# Patient Record
Sex: Male | Born: 2000 | Race: Black or African American | Hispanic: No | Marital: Single | Smoking: Never smoker
Health system: Southern US, Community
[De-identification: ages and names within clinical notes are randomized; demographics above are authoritative.]

## PROBLEM LIST (undated history)

## (undated) DIAGNOSIS — T7840XA Allergy, unspecified, initial encounter: Secondary | ICD-10-CM

## (undated) DIAGNOSIS — D649 Anemia, unspecified: Secondary | ICD-10-CM

## (undated) DIAGNOSIS — F988 Other specified behavioral and emotional disorders with onset usually occurring in childhood and adolescence: Secondary | ICD-10-CM

## (undated) DIAGNOSIS — M419 Scoliosis, unspecified: Secondary | ICD-10-CM

## (undated) DIAGNOSIS — G43909 Migraine, unspecified, not intractable, without status migrainosus: Secondary | ICD-10-CM

## (undated) HISTORY — DX: Anemia, unspecified: D64.9

## (undated) HISTORY — DX: Allergy, unspecified, initial encounter: T78.40XA

## (undated) HISTORY — PX: CIRCUMCISION: SUR203

## (undated) HISTORY — PX: EYE SURGERY: SHX253

## (undated) HISTORY — PX: HERNIA REPAIR: SHX51

---
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
## 2022-04-11 ENCOUNTER — Emergency Department (HOSPITAL_BASED_OUTPATIENT_CLINIC_OR_DEPARTMENT_OTHER)
Admission: EM | Admit: 2022-04-11 | Discharge: 2022-04-11 | Disposition: A | Discharge status: Home / Self Care | Payer: Medicaid Other | Attending: Emergency Medicine | Admitting: Emergency Medicine

## 2022-04-11 ENCOUNTER — Encounter (HOSPITAL_BASED_OUTPATIENT_CLINIC_OR_DEPARTMENT_OTHER): Payer: Self-pay | Admitting: Emergency Medicine

## 2022-04-11 ENCOUNTER — Other Ambulatory Visit: Payer: Self-pay

## 2022-04-11 DIAGNOSIS — R519 Headache, unspecified: Secondary | ICD-10-CM | POA: Diagnosis present

## 2022-04-11 DIAGNOSIS — G43809 Other migraine, not intractable, without status migrainosus: Secondary | ICD-10-CM

## 2022-04-11 DIAGNOSIS — G43909 Migraine, unspecified, not intractable, without status migrainosus: Secondary | ICD-10-CM | POA: Diagnosis not present

## 2022-04-11 MED ORDER — KETOROLAC TROMETHAMINE 15 MG/ML IJ SOLN
15.0000 mg | Freq: Once | INTRAMUSCULAR | Status: AC
  Administered 2022-04-11: 15 mg via INTRAVENOUS
  Filled 2022-04-11: qty 1

## 2022-04-11 MED ORDER — ONDANSETRON 4 MG PO TBDP
4.0000 mg | ORAL_TABLET | Freq: Once | ORAL | Status: AC
  Administered 2022-04-11: 4 mg via ORAL
  Filled 2022-04-11: qty 1

## 2022-04-11 MED ORDER — PROCHLORPERAZINE EDISYLATE 10 MG/2ML IJ SOLN
10.0000 mg | Freq: Once | INTRAMUSCULAR | Status: AC
  Administered 2022-04-11: 10 mg via INTRAVENOUS
  Filled 2022-04-11: qty 2

## 2022-04-11 MED ORDER — DIPHENHYDRAMINE HCL 50 MG/ML IJ SOLN
50.0000 mg | Freq: Once | INTRAMUSCULAR | Status: AC
Start: 2022-04-11 — End: 2022-04-11
  Administered 2022-04-11: 50 mg via INTRAVENOUS
  Filled 2022-04-11: qty 1

## 2022-04-11 MED ORDER — SODIUM CHLORIDE 0.9 % IV BOLUS
1000.0000 mL | Freq: Once | INTRAVENOUS | Status: AC
  Administered 2022-04-11: 1000 mL via INTRAVENOUS

## 2022-04-11 MED ORDER — DEXAMETHASONE SODIUM PHOSPHATE 4 MG/ML IJ SOLN
4.0000 mg | Freq: Once | INTRAMUSCULAR | Status: AC
  Administered 2022-04-11: 4 mg via INTRAVENOUS
  Filled 2022-04-11: qty 1

## 2022-04-11 NOTE — ED Triage Notes (Signed)
Pt arrives pov, steady gait to triage, c/o HA since waking, hx of migraines. Endorses n/v ?

## 2022-04-11 NOTE — ED Provider Notes (Signed)
?[facility] EMERGENCY DEPARTMENT ?Provider Note ? ? ?CSN: [id number] ?Arrival date & time: 04/11/22  1618 ? ?  ? ?History ? ?Chief Complaint  ?Patient presents with  ? Headache  ? ? ?Zachary Castaneda is a 21 y.o. male. ? ? ?Headache ? ?Patient is a 21 year old male with history of frequent migraines presenting today with migraine.  Started around 10 AM this morning, he had an associated aura and it was followed by nausea and vomiting.  He is unable to keep any food or water down.  Endorses photophobia and photophobia, states it feels like his typical migraines.  He has not been seen by neurologist in some time but last seen by pediatric neurologist. ? ?Home Medications ?Prior to Admission medications   ?Medication Sig Start Date End Date Taking? Authorizing Provider  ?fluticasone (FLONASE) 50 MCG/ACT nasal spray Place 1 spray into both nostrils daily as needed for allergies or rhinitis.     [provider]  ?ibuprofen (ADVIL) 800 MG tablet Take 1 tablet (800 mg total) by mouth every 6 (six) hours as needed for mild pain or moderate pain. 05/28/20   Adibe, Felix Pacini, MD  ?lisdexamfetamine (VYVANSE) 50 MG capsule Take 50 mg by mouth See admin instructions. Take 50 mg daily Monday through Friday during the school year 08/31/16   [provider]  ?Multiple Vitamins-Minerals (ADULT GUMMY) CHEW Chew 1 tablet by mouth daily.    [provider]  ?ondansetron (ZOFRAN) 4 MG tablet Take 4 mg by mouth every 8 (eight) hours as needed for nausea or vomiting.    [provider]  ?pantoprazole (PROTONIX) 20 MG tablet Take 1 tablet (20 mg total) by mouth daily. 11/17/21 12/17/21  Virgina Norfolk, DO  ?   ? ?Allergies    ?Blueberry [berry]   ? ?Review of Systems   ?Review of Systems  ?Neurological:  Positive for headaches.  ? ?Physical Exam ?Updated Vital Signs ?BP 126/83   Pulse 61   Resp 16   Ht 6\' 5"  (1.956 m)   Wt 86.2 kg   SpO2 100%   BMI 22.53 kg/m?  ?Physical Exam ?Vitals and  nursing note reviewed. Exam conducted with a chaperone present.  ?Constitutional:   ?   General: He is not in acute distress. ?   Appearance: Normal appearance.  ?HENT:  ?   Head: Normocephalic and atraumatic.  ?Eyes:  ?   General: No scleral icterus. ?   Extraocular Movements: Extraocular movements intact.  ?   Right eye: Normal extraocular motion and no nystagmus.  ?   Left eye: Normal extraocular motion and no nystagmus.  ?   Pupils: Pupils are equal, round, and reactive to light.  ?Cardiovascular:  ?   Rate and Rhythm: Normal rate and regular rhythm.  ?Pulmonary:  ?   Effort: Pulmonary effort is normal.  ?   Breath sounds: Normal breath sounds.  ?Skin: ?   Coloration: Skin is not jaundiced.  ?Neurological:  ?   Mental Status: He is alert. Mental status is at baseline.  ?   GCS: GCS eye subscore is 4. GCS verbal subscore is 5. GCS motor subscore is 6.  ?   Cranial Nerves: No cranial nerve deficit.  ?   Coordination: Coordination normal.  ? ? ?ED Results / Procedures / Treatments   ?Labs ?(all labs ordered are listed, but only abnormal results are displayed) ?Labs Reviewed - No data to display ? ?EKG ?None ? ?Radiology ?No results found. ? ?Procedures ?Procedures  ? ? ?  Medications Ordered in ED ?Medications  ?ondansetron (ZOFRAN-ODT) disintegrating tablet 4 mg (4 mg Oral Given 04/11/22 1629)  ?sodium chloride 0.9 % bolus 1,000 mL (1,000 mLs Intravenous New Bag/Given 04/11/22 1914)  ?ketorolac (TORADOL) 15 MG/ML injection 15 mg (15 mg Intravenous Given 04/11/22 1915)  ?dexamethasone (DECADRON) injection 4 mg (4 mg Intravenous Given 04/11/22 1914)  ?prochlorperazine (COMPAZINE) injection 10 mg (10 mg Intravenous Given 04/11/22 1952)  ?diphenhydrAMINE (BENADRYL) injection 50 mg (50 mg Intravenous Given 04/11/22 1953)  ? ? ?ED Course/ Medical Decision Making/ A&P ?  ?                        ?Medical Decision Making ?Risk ?Prescription drug management. ? ? ?Patient presents with migraine.  He is companied by his mother at  bedside who is an independent historian.  There were no focal deficits on neuro exam, migraines are classic of his typical presentation.  I considered alternative causes such as meningitis but does not fit clinical picture. ? ?Ordered patient migraine cocktail including Compazine, Benadryl, Toradol, Decadron and fluids.  His symptoms improved.  Patient does not currently have primary care doctor in the area, provided information to establish care.   ? ? ? ? ? ? ? ?Final Clinical Impression(s) / ED Diagnoses ?Final diagnoses:  ?None  ? ? ?Rx / DC Orders ?ED Discharge Orders   ? ? None  ? ?  ? ? ?  ?Theron Arista, PA-C ?04/11/22 2034 ? ?  ?Arby Barrette, MD ?04/12/22 1945 ? ?

## 2022-04-11 NOTE — ED Notes (Signed)
Patient discharged to home.  All discharge instructions reviewed.  Patient verbalized understanding via teachback method.  Instructions also reviewed with patient's mother, as patient is drowsy following medication administration.  VS WDL.  Respirations even and unlabored.  Ambulatory out of ED.   ?

## 2022-04-11 NOTE — Discharge Instructions (Signed)
Follow up with PCP. If you do not have one currently above is an option in the [facility].  ?

## 2022-06-11 IMAGING — CR DG CHEST 2V
2 series · 2 of 2 positions shown · non-contrast
Comparison: 02/08/2016

CLINICAL DATA: Chest pain

EXAM:
CHEST - 2 VIEW

[w chest pa]
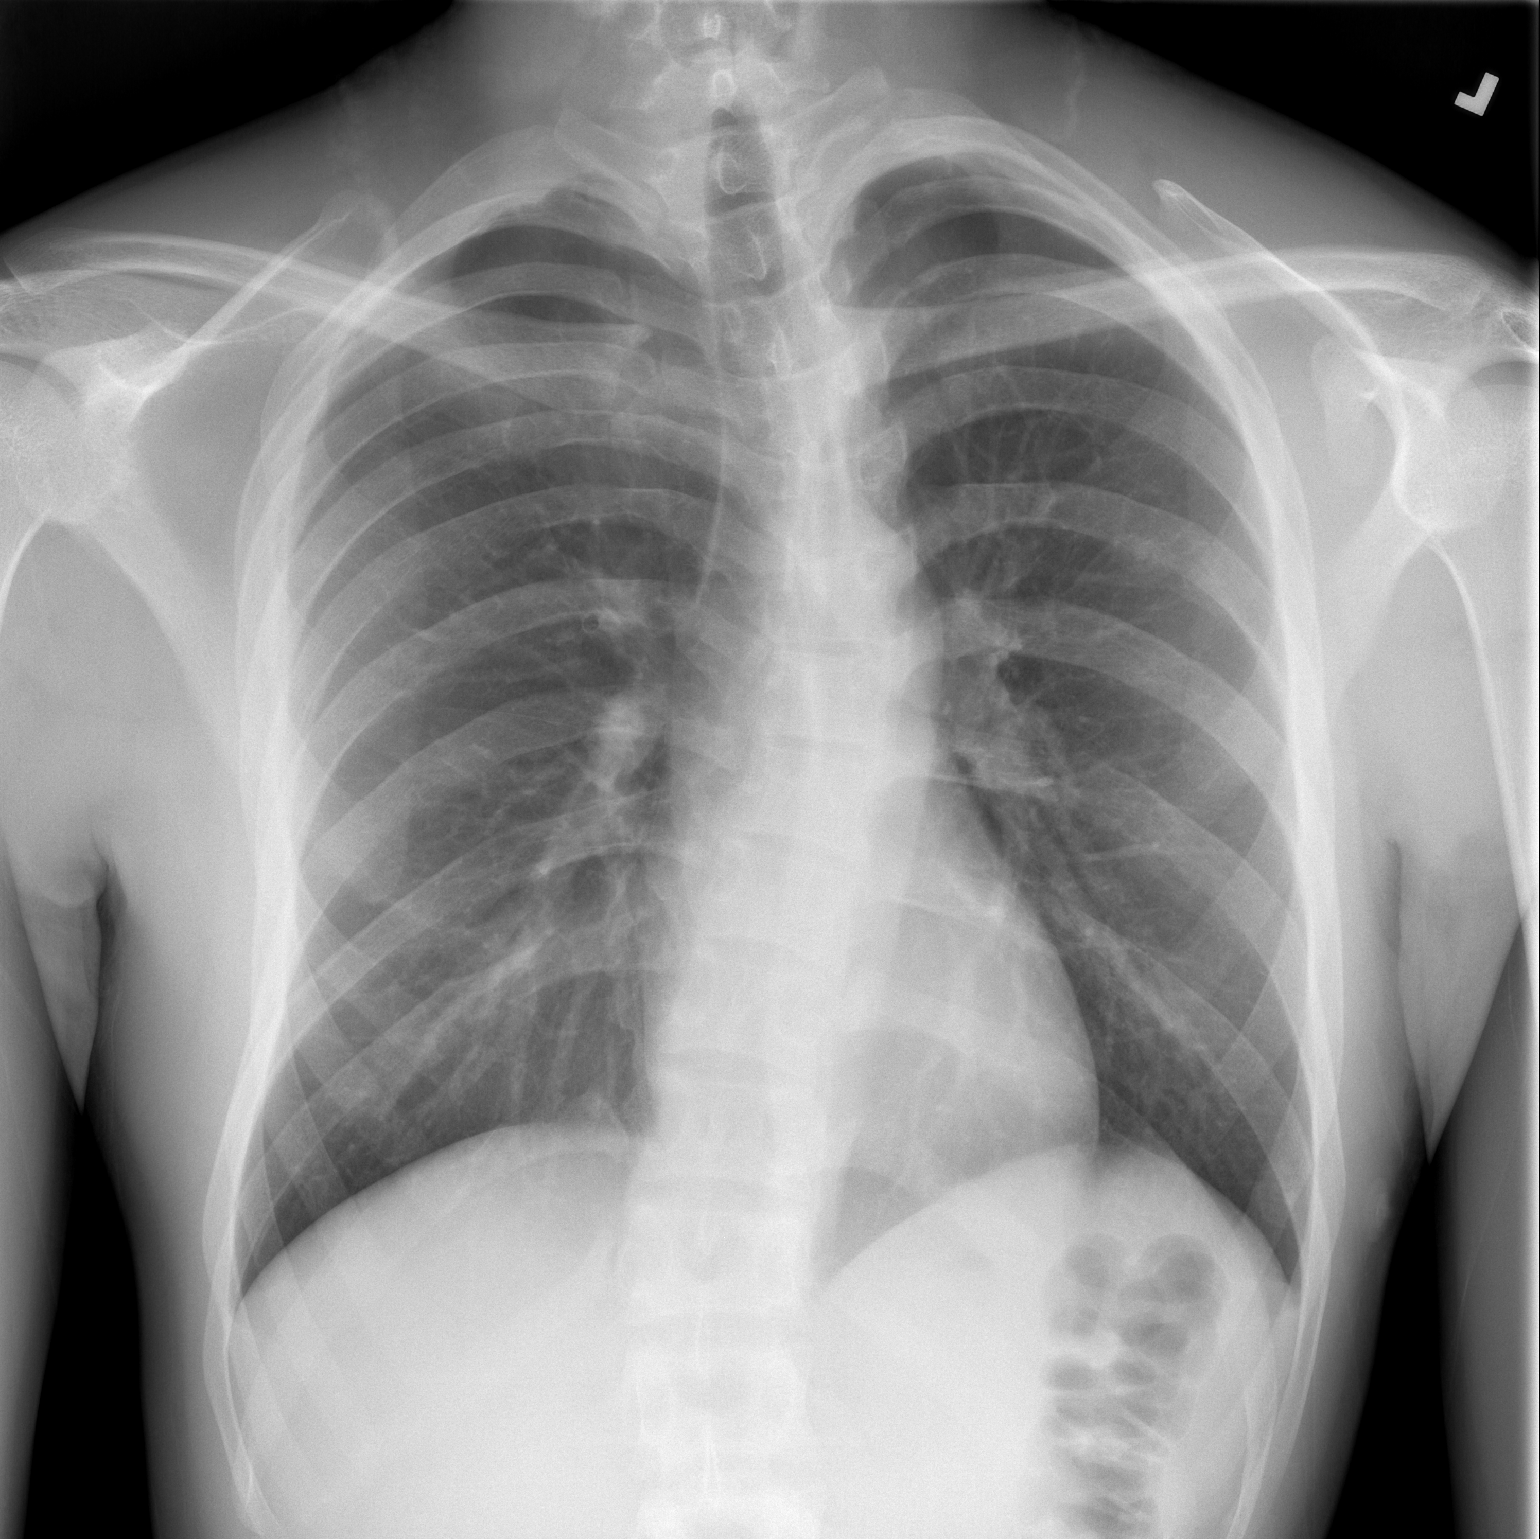

[w chest lat]
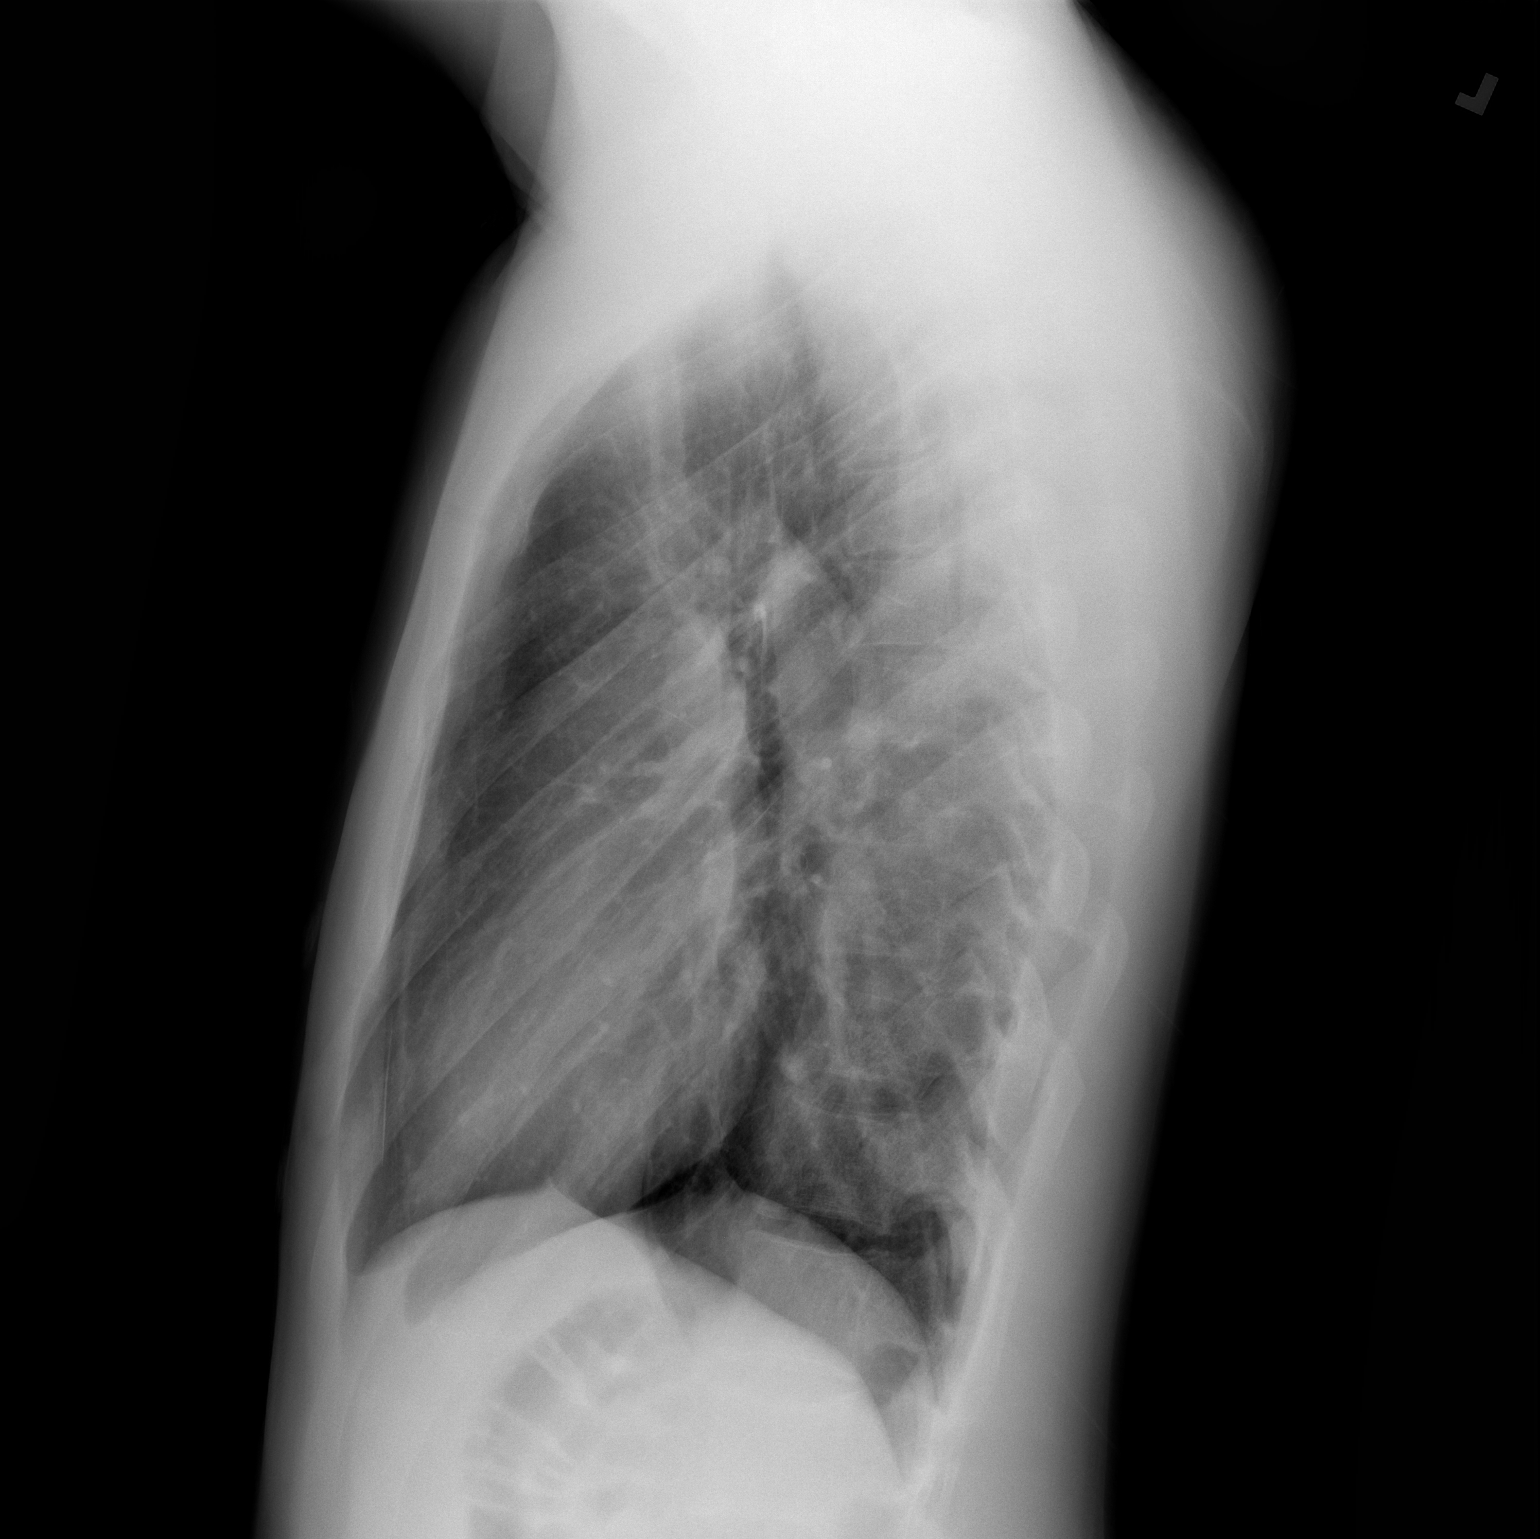

[2 of 2 positions shown; findings below may reference images not displayed]

FINDINGS: The heart size and mediastinal contours are within normal limits.
Both lungs are clear. Thoracic scoliosis is convex towards the left.
No acute osseous findings identified.
IMPRESSION: No active cardiopulmonary disease.

## 2024-08-14 ENCOUNTER — Other Ambulatory Visit: Payer: Self-pay

## 2024-08-14 ENCOUNTER — Emergency Department (HOSPITAL_BASED_OUTPATIENT_CLINIC_OR_DEPARTMENT_OTHER)
Admission: EM | Admit: 2024-08-14 | Discharge: 2024-08-15 | Disposition: A | Discharge status: Home / Self Care | Attending: Emergency Medicine | Admitting: Emergency Medicine

## 2024-08-14 ENCOUNTER — Encounter (HOSPITAL_BASED_OUTPATIENT_CLINIC_OR_DEPARTMENT_OTHER): Payer: Self-pay | Admitting: Emergency Medicine

## 2024-08-14 DIAGNOSIS — G43809 Other migraine, not intractable, without status migrainosus: Secondary | ICD-10-CM | POA: Insufficient documentation

## 2024-08-14 DIAGNOSIS — G43909 Migraine, unspecified, not intractable, without status migrainosus: Secondary | ICD-10-CM | POA: Diagnosis present

## 2024-08-14 MED ORDER — PROCHLORPERAZINE EDISYLATE 10 MG/2ML IJ SOLN
10.0000 mg | Freq: Once | INTRAMUSCULAR | Status: DC
Start: 2024-08-14 — End: 2024-08-15
  Filled 2024-08-14: qty 2

## 2024-08-14 MED ORDER — KETOROLAC TROMETHAMINE 30 MG/ML IJ SOLN
30.0000 mg | Freq: Once | INTRAMUSCULAR | Status: AC
  Administered 2024-08-14: 30 mg via INTRAVENOUS
  Filled 2024-08-14: qty 1

## 2024-08-14 MED ORDER — SODIUM CHLORIDE 0.9 % IV BOLUS
1000.0000 mL | Freq: Once | INTRAVENOUS | Status: AC
  Administered 2024-08-14: 1000 mL via INTRAVENOUS

## 2024-08-14 NOTE — ED Triage Notes (Signed)
 Patient c/o migraine headache all week, patient has history of migraines, does not take meds for them.  Patient endorses photo-sensitivity.

## 2024-08-14 NOTE — ED Provider Notes (Signed)
 [place] [facility] AT [facility]  Provider Note  CSN: [id number] Arrival date & time: 08/14/24 2151  History Chief Complaint  Patient presents with   Migraine    Nikai Quest is a 23 y.o. male with history if migraines has had typical migraine headache off and on for the last 2 weeks. Described as a frontal headache with photophobia. He also reports he felt 'disoriented' for a time yesterday but not today. He has not had fever or neck stiffness, no thunderclap headache. Not prescribed any medications for migraines.    Home Medications Prior to Admission medications   Medication Sig Start Date End Date Taking? Authorizing Provider  fluticasone (FLONASE) 50 MCG/ACT nasal spray Place 1 spray into both nostrils daily as needed for allergies or rhinitis.     [provider]  ibuprofen  (ADVIL ) 800 MG tablet Take 1 tablet (800 mg total) by mouth every 6 (six) hours as needed for mild pain or moderate pain. 05/28/20   Adibe, Obinna O, MD  lisdexamfetamine (VYVANSE) 50 MG capsule Take 50 mg by mouth See admin instructions. Take 50 mg daily Monday through Friday during the school year 08/31/16   [provider]  Multiple Vitamins-Minerals (ADULT GUMMY) CHEW Chew 1 tablet by mouth daily.    [provider]  ondansetron  (ZOFRAN ) 4 MG tablet Take 4 mg by mouth every 8 (eight) hours as needed for nausea or vomiting.    [provider]  pantoprazole  (PROTONIX ) 20 MG tablet Take 1 tablet (20 mg total) by mouth daily. 11/17/21 12/17/21  Ruthe Cornet, DO     Allergies    Blueberry [berry]   Review of Systems   Review of Systems Please see HPI for pertinent positives and negatives  Physical Exam BP 116/78 (BP Location: Left Arm)   Pulse (!) 107   Temp 99.1 F (37.3 C) (Oral)   Resp 14   Ht 6' 5 (1.956 m)   Wt 86.2 kg   SpO2 99%   BMI 22.53 kg/m   Physical Exam Vitals and nursing note reviewed.  Constitutional:       Appearance: Normal appearance.  HENT:     Head: Normocephalic and atraumatic.     Nose: Nose normal.     Mouth/Throat:     Mouth: Mucous membranes are moist.  Eyes:     Extraocular Movements: Extraocular movements intact.     Conjunctiva/sclera: Conjunctivae normal.     Pupils: Pupils are equal, round, and reactive to light.  Cardiovascular:     Rate and Rhythm: Normal rate.  Pulmonary:     Effort: Pulmonary effort is normal.     Breath sounds: Normal breath sounds.  Abdominal:     General: Abdomen is flat.     Palpations: Abdomen is soft.     Tenderness: There is no abdominal tenderness.  Musculoskeletal:        General: No swelling. Normal range of motion.     Cervical back: Neck supple.  Skin:    General: Skin is warm and dry.  Neurological:     General: No focal deficit present.     Mental Status: He is alert and oriented to person, place, and time.     Cranial Nerves: No cranial nerve deficit.     Sensory: No sensory deficit.     Motor: No weakness.  Psychiatric:        Mood and Affect: Mood normal.     ED Results / Procedures / Treatments  EKG None  Procedures Procedures  Medications Ordered in the ED Medications  prochlorperazine  (COMPAZINE ) injection 10 mg (10 mg Intravenous Patient Refused/Not Given 08/14/24 2350)  ketorolac  (TORADOL ) 30 MG/ML injection 30 mg (30 mg Intravenous Given 08/14/24 2353)  sodium chloride  0.9 % bolus 1,000 mL (0 mLs Intravenous Stopped 08/15/24 0054)    Initial Impression and Plan  Patient with history of migraines here with typical headache. Vitals and exam are reassuring. Unclear what his episode of disorientation yesterday was, he describes it as 'having memories that weren't his'. Regardless, he is awake and alert now, will give migraine cocktail and reassess.   ED Course   Clinical Course as of 08/15/24 0114  Wed Aug 15, 2024  0053 Patient declined compazine  due to prior akathisia reaction.  [CS]  0113 Patient reports  headache is improved and he is ready to go home. Recommend PCP and/or Neurology follow up for long term management. RTED for any other concerns.  [CS]    Clinical Course User Index [CS] Roselyn Carlin NOVAK, MD     MDM Rules/Calculators/A&P Medical Decision Making Problems Addressed: Other migraine without status migrainosus, not intractable: acute illness or injury  Risk Prescription drug management.     Final Clinical Impression(s) / ED Diagnoses Final diagnoses:  Other migraine without status migrainosus, not intractable    Rx / DC Orders ED Discharge Orders          Ordered    Ambulatory referral to Neurology       Comments: An appointment is requested in approximately: 4 weeks   08/15/24 0114             Roselyn Carlin NOVAK, MD 08/15/24 [phone]

## 2024-08-15 NOTE — ED Notes (Signed)
 Patient refuses x2 compazine  per EDP. States he doees not like the way it makes him feel.

## 2024-08-20 ENCOUNTER — Encounter (HOSPITAL_COMMUNITY): Payer: Self-pay

## 2024-08-20 ENCOUNTER — Emergency Department (HOSPITAL_COMMUNITY)
Admission: EM | Admit: 2024-08-20 | Discharge: 2024-08-21 | Disposition: A | Discharge status: Home / Self Care | Attending: Emergency Medicine | Admitting: Emergency Medicine

## 2024-08-20 ENCOUNTER — Other Ambulatory Visit: Payer: Self-pay

## 2024-08-20 DIAGNOSIS — Z79899 Other long term (current) drug therapy: Secondary | ICD-10-CM | POA: Insufficient documentation

## 2024-08-20 DIAGNOSIS — G43809 Other migraine, not intractable, without status migrainosus: Secondary | ICD-10-CM | POA: Insufficient documentation

## 2024-08-20 DIAGNOSIS — R519 Headache, unspecified: Secondary | ICD-10-CM | POA: Diagnosis present

## 2024-08-20 NOTE — ED Triage Notes (Addendum)
 Pt arrived from [facility] via POV with parents at bedside. Pt states that he has had migraine headaches for weeks which have blurry vision as a prescursor. Pts mother spoke up during triage and states that the pt has notable lost weight recently and had blood shot eyes and well as pain though out his limbs. Pt then stated that he gets nauseous if he is about to have a headache and he eats something. Pt at present does not have a head ahce or nausea. Pt states that he does not currently have a PCP to follow up with. Pts mother again spoke up and states that pt was seen at Urgent care last week and was given a shot for the headache, but that no blood work was done and no one tested him for the virus. [facility] in triage 0

## 2024-08-20 NOTE — ED Triage Notes (Signed)
 Pt in with migraine x several weeks, states he began having R arm and L side numbness a few days ago.

## 2024-08-21 ENCOUNTER — Emergency Department (HOSPITAL_COMMUNITY)

## 2024-08-21 LAB — CBC
HCT: 37.5 % — ABNORMAL LOW (ref 39.0–52.0)
Hemoglobin: 11.6 g/dL — ABNORMAL LOW (ref 13.0–17.0)
MCH: 21.3 pg — ABNORMAL LOW (ref 26.0–34.0)
MCHC: 30.9 g/dL (ref 30.0–36.0)
MCV: 68.9 fL — ABNORMAL LOW (ref 80.0–100.0)
Platelets: 280 K/uL (ref 150–400)
RBC: 5.44 MIL/uL (ref 4.22–5.81)
RDW: 14.4 % (ref 11.5–15.5)
WBC: 7.6 K/uL (ref 4.0–10.5)
nRBC: 0 % (ref 0.0–0.2)

## 2024-08-21 LAB — BASIC METABOLIC PANEL WITH GFR
Anion gap: 10 (ref 5–15)
BUN: <5 mg/dL — ABNORMAL LOW (ref 6–20)
CO2: 25 mmol/L (ref 22–32)
Calcium: 8.8 mg/dL — ABNORMAL LOW (ref 8.9–10.3)
Chloride: 100 mmol/L (ref 98–111)
Creatinine, Ser: 0.94 mg/dL (ref 0.61–1.24)
GFR, Estimated: >60 mL/min (ref >60)
Glucose, Bld: 93 mg/dL (ref 70–99)
Potassium: 4 mmol/L (ref 3.5–5.1)
Sodium: 135 mmol/L (ref 135–145)

## 2024-08-21 LAB — RESP PANEL BY RT-PCR (RSV, FLU A&B, COVID)  RVPGX2
Influenza A by PCR: NEGATIVE
Influenza B by PCR: NEGATIVE
Resp Syncytial Virus by PCR: NEGATIVE
SARS Coronavirus 2 by RT PCR: NEGATIVE

## 2024-08-21 MED ORDER — KETOROLAC TROMETHAMINE 15 MG/ML IJ SOLN
15.0000 mg | Freq: Once | INTRAMUSCULAR | Status: AC
  Administered 2024-08-21: 15 mg via INTRAVENOUS
  Filled 2024-08-21: qty 1

## 2024-08-21 MED ORDER — METOCLOPRAMIDE HCL 5 MG/ML IJ SOLN
10.0000 mg | Freq: Once | INTRAMUSCULAR | Status: AC
  Administered 2024-08-21: 10 mg via INTRAVENOUS
  Filled 2024-08-21: qty 2

## 2024-08-21 MED ORDER — DIPHENHYDRAMINE HCL 50 MG/ML IJ SOLN
12.5000 mg | Freq: Once | INTRAMUSCULAR | Status: AC
  Administered 2024-08-21: 12.5 mg via INTRAVENOUS
  Filled 2024-08-21: qty 1

## 2024-08-21 NOTE — ED Notes (Signed)
 Patient mother states that a couple days ago patient was disoriented.

## 2024-08-21 NOTE — ED Provider Notes (Signed)
 [place] [facility] DEPARTMENT AT [facility] Provider Note   CSN: [id number] Arrival date & time: 08/20/24  2246     Patient presents with: Headache and Numbness   Zachary Castaneda is a 23 y.o. male.   The history is provided by the patient and a parent.  Patient reports migraine headache for several weeks.  He reports at times he will have blurred vision when the headache starts.  He has also had intermittent confusion per parents.  No falls or head trauma.  No travel.  No fevers or vomiting.  No tick bites or rashes.  He has had headaches intermittently for years but this appears worse than usual.  Mother reports that he appears to have lost weight.  The headache is not sudden onset  No focal weakness.  He has had intermittent numbness in the right arm but none at this time Denies any visual loss or diplopia. Reports family history of migraines, but no other family history of neurologic conditions.   Mother is concerned that no recent workup has been done for patient  Prior to Admission medications   Medication Sig Start Date End Date Taking? Authorizing Provider  fluticasone (FLONASE) 50 MCG/ACT nasal spray Place 1 spray into both nostrils daily as needed for allergies or rhinitis.     [provider]  lisdexamfetamine (VYVANSE) 50 MG capsule Take 50 mg by mouth See admin instructions. Take 50 mg daily Monday through Friday during the school year 08/31/16   [provider]  Multiple Vitamins-Minerals (ADULT GUMMY) CHEW Chew 1 tablet by mouth daily.    [provider]    Allergies: Blueberry [berry]    Review of Systems  Constitutional:  Positive for fatigue and unexpected weight change. Negative for fever.  Eyes:  Positive for photophobia.       Denies diplopia  Skin:  Negative for rash.  Neurological:  Positive for numbness and headaches. Negative for seizures, syncope, facial asymmetry, speech difficulty and weakness.    Updated  Vital Signs BP 109/74   Pulse 92   Temp 99 F (37.2 C) (Oral)   Resp 14   Ht 1.956 m (6' 5)   Wt 83.9 kg   SpO2 100%   BMI 21.94 kg/m   Physical Exam CONSTITUTIONAL: Well developed/well nourished HEAD: Normocephalic/atraumatic EYES: EOMI/PERRL, no nystagmus, no ptosis, normal fundoscopic exam (no papilledema)  ENMT: Mucous membranes moist NECK: supple no meningeal signs, no bruits SPINE/BACK:entire spine nontender CV: S1/S2 noted, no murmurs/rubs/gallops noted LUNGS: Lungs are clear to auscultation bilaterally, no apparent distress ABDOMEN: soft, nontender, no rebound or guarding GU:no cva tenderness NEURO:Awake/alert, face symmetric, no arm or leg drift is noted Equal 5/5 strength with shoulder abduction, elbow flex/extension, wrist flex/extension in upper extremities and equal hand grips bilaterally Equal 5/5 strength with hip flexion,knee flex/extension, foot dorsi/plantar flexion Cranial nerves 3/4/5/6/06/27/09/11/12 tested and intact Gait normal without ataxia Sensation to light touch intact in all extremities EXTREMITIES: pulses normal, full ROM, no deformtieis SKIN: warm, color normal PSYCH: no abnormalities of mood noted, alert and oriented to situation  (all labs ordered are listed, but only abnormal results are displayed) Labs Reviewed  BASIC METABOLIC PANEL WITH GFR - Abnormal; Notable for the following components:      Result Value   BUN <5 (*)    Calcium 8.8 (*)    All other components within normal limits  CBC - Abnormal; Notable for the following components:   Hemoglobin 11.6 (*)    HCT 37.5 (*)  MCV 68.9 (*)    MCH 21.3 (*)    All other components within normal limits  RESP PANEL BY RT-PCR (RSV, FLU A&B, COVID)  [id number]    EKG: None  Radiology: MR MRV HEAD WO CM Result Date: 08/21/2024 EXAM: MRV BRAIN 08/21/2024 01:55:59 AM TECHNIQUE: Multiplanar multisequence MRV of the head was performed without the administration of intravenous contrast.  Multiplanar 2D and 3D reformatted images are provided for review. COMPARISON: None available. CLINICAL HISTORY: Dural venous sinus thrombosis suspected; Headache, intracranial hypertension features. 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 Otherwise, normal appearance of the major intracranial venous structures. IMPRESSION: 1. No evidence of dural venous sinus thrombosis. 2. Diminutive left transverse sinus, a common congenital variant. Electronically signed by: Franky Stanford MD 08/21/2024 02:07 AM EDT RP Workstation: [id number]   MR BRAIN WO CONTRAST Result Date: 08/21/2024 EXAM: MR Brain without Intravenous Contrast. CLINICAL HISTORY: Headache, tension-type. TECHNIQUE: Magnetic resonance images of the brain without intravenous contrast in multiple planes. CONTRAST: Without. COMPARISON: None provided. FINDINGS: BRAIN: No restricted diffusion to indicate acute infarction. No intracranial mass or hemorrhage. No midline shift or extra-axial fluid collection. No cerebellar tonsillar ectopia. The central arterial and venous flow voids are patent. VENTRICLES: No hydrocephalus. ORBITS: The orbits are normal. SINUSES AND MASTOIDS: The sinuses and mastoid air cells are clear. BONES: No acute fracture or focal osseous lesion. IMPRESSION: 1. Unremarkable brain MRI. Electronically signed by: Franky Stanford MD 08/21/2024 02:01 AM EDT RP Workstation: [id number]     Procedures   Medications Ordered in the ED  metoCLOPramide  (REGLAN ) injection 10 mg (10 mg Intravenous Given 08/21/24 0049)  diphenhydrAMINE  (BENADRYL ) injection 12.5 mg (12.5 mg Intravenous Given 08/21/24 0047)  ketorolac  (TORADOL ) 15 MG/ML injection 15 mg (15 mg Intravenous Given 08/21/24 0048)    Clinical Course as of 08/21/24 0240  Tue Aug 21, 2024  0045 Patient reports ongoing migraine for weeks. Mother also thinks he has lost weight.  He has never had a formal diagnosis/evaluation for migraines.  Due to persistent  headaches, will obtain MRI/MRV brain Will also treat his headaches. If all workup is negative, patient will be safe for outpatient management and urgent referral to [DW]  0238 Patient feeling improved.  MRI negative. Labs overall reassuring Referral to neurology has been placed.  Also gave instructions to have a follow-up with a PCP  Patient and parents are comfortable w/exam [DW]    Clinical Course User Index [DW] Midge Golas, MD                                 Medical Decision Making Amount and/or Complexity of Data Reviewed Labs: ordered. Radiology: ordered.  Risk Prescription drug management.   This patient presents to the ED for concern of headache, this involves an extensive number of treatment options, and is a complaint that carries with it a high risk of complications and morbidity.  The differential diagnosis includes but is not limited to subarachnoid hemorrhage, intracranial hemorrhage, meningitis, encephalitis, CVST, idiopathic intracranial hypertension, migraine    Comorbidities that complicate the patient evaluation: Patient's presentation is complicated by their history of previous migraines  Social Determinants of Health: Patient's impaired access to primary care  increases the complexity of managing their presentation  Additional history obtained: Additional history obtained from family Records reviewed previous admission documents  Lab Tests: I Ordered, and personally interpreted labs.  The pertinent results include: Labs overall reassuring, at  baseline  Imaging Studies ordered: I ordered imaging studies including MRI brain  I independently visualized and interpreted imaging which showed no acute findings I agree with the radiologist interpretation  Medicines ordered and prescription drug management: I ordered medication including reglan   for headache  Reevaluation of the patient after these medicines showed that the patient     improved   Reevaluation: After the interventions noted above, I reevaluated the patient and found that they have :improved  Complexity of problems addressed: Patient's presentation is most consistent with  acute presentation with potential threat to life or bodily function  Disposition: After consideration of the diagnostic results and the patient's response to treatment,  I feel that the patent would benefit from discharge  .        Final diagnoses:  Other migraine without status migrainosus, not intractable    ED Discharge Orders          Ordered    Ambulatory referral to Neurology       Comments: An appointment is requested in approximately: 2 weeks   08/21/24 0045               Midge Golas, MD 08/21/24 [phone]

## 2024-08-21 NOTE — Discharge Instructions (Addendum)
 You can go to [email or web address] to find a provider near where you live  SEEK MEDICAL ATTENTION IF:  You develop possible problems with medications prescribed.  The medications don't resolve your headache, if it recurs , or if you have multiple episodes of vomiting or can't take fluids. You have a change from the usual headache.  RETURN IMMEDIATELY IF you develop a sudden, severe headache or confusion, become poorly responsive or faint, develop a fever above 100.61F or problem breathing, have a change in speech, vision, swallowing, or understanding, or develop new weakness, numbness, tingling, incoordination, or have a seizure.

## 2024-08-21 NOTE — ED Notes (Signed)
 Patient transported to MRI

## 2024-08-27 ENCOUNTER — Ambulatory Visit: Payer: Self-pay

## 2024-08-27 NOTE — Telephone Encounter (Signed)
  FYI Only or Action Required?: FYI only for provider.  Patient was last seen in primary care on new patient.  Called Nurse Triage reporting Migraine and Blurred Vision.  Symptoms began several years ago.  Interventions attempted: OTC medications: tylenol /ibuprofen .  Symptoms are: unchanged.  Triage Disposition: Home Care  Patient/caregiver understands and will follow disposition?: Yes  Message from [facility] sent at 08/27/2024 11:35 AM EDT  Summary: Symptoms. not feeling well.   Reason for Triage: Patient's dad called for patient but would like for patient to be contacted. Patient been having blurred vision, right side, patient has been having on and off migraines but they are not botheing hit right now. Patient was taken to the ER and needs to see a PCP. Patient is scheduled for an appointment for 09/26.  [phone]         Reason for Disposition  Similar to previously diagnosed migraine headaches  Answer Assessment - Initial Assessment Questions 1. LOCATION: Where does it hurt?      Different places, front side or back of head 2. ONSET: When did the headache start? (e.g., minutes, hours, days)      History or migraine since childhood, about 14 years 3. PATTERN: Does the pain come and go, or has it been constant since it started?     Comes and goes 4. SEVERITY: How bad is the pain? and What does it keep you from doing?  (e.g., Scale 1-10; mild, moderate, or severe)     No head ache at this time or visual disturbances 5. RECURRENT SYMPTOM: Have you ever had headaches before? If Yes, ask: When was the last time? and What happened that time?      Has never taken migraine preventative, uses otc tylenol  or ibuprofen  to treat. Lately the medications have not been helping 6. CAUSE: What do you think is causing the headache?    Extra bright Light triggers migraine 7. MIGRAINE: Have you been diagnosed with migraine headaches? If Yes, ask: Is this headache  similar?      yes 8. HEAD INJURY: Has there been any recent injury to your head?      denies 9. OTHER SYMPTOMS: Do you have any other symptoms? (e.g., fever, stiff neck, eye pain, sore throat, cold symptoms)     Blurred vision to both eyes at times, random spots of blurriness and at times only peripheral is blurry 10. PREGNANCY: Is there any chance you are pregnant? When was your last menstrual period?       N/A  Protocols used: Headache-A-AH

## 2024-08-31 ENCOUNTER — Ambulatory Visit: Payer: Self-pay

## 2024-08-31 NOTE — Telephone Encounter (Signed)
 FYI Only or Action Required?: FYI only for provider.  Patient was last seen in primary care on new patient.  Called Nurse Triage reporting Migraine.  Symptoms began several years ago.  Interventions attempted: Rest, hydration, or home remedies and Other: multiple ED visits.  Symptoms are: gradually worsening.  Triage Disposition: Information or Advice Only Call  Patient/caregiver understands and will follow disposition?: Yes   Copied from CRM #[id number]. Topic: Clinical - Red Word Triage >> Aug 31, 2024 12:10 PM Franky GRADE wrote: Red Word that prompted transfer to Nurse Triage: Patient's father is calling because patient has been experiencing Cronic Migraine, loss of apetite, dizziness, weakness and blurred vision. His father has taken him to the ED multiple times and they only treat the migraine. Reason for Disposition  General information question, no triage required and triager able to answer question  Answer Assessment - Initial Assessment Questions Migraine Weight loss, loss of appetite Body aches/leg pain Weakness Multiple trips to ED will only treat for migraine pain  Answer Assessment - Initial Assessment Questions Father of adult patient calls to report Continued Migraine Weight loss, loss of appetite Body aches/leg pain Weakness Multiple trips to ED will only treat for migraine pain and not investigate the cause of symptoms. Continually told to follow up with PCP  Called CALL at [facility], no sooner new patient appointments available than scheduled appointment for 09/14/2024  Information given for mobile clinic/ Patient will call to find location on Monday.  Until then, will continue to treat migraine symptoms, offer small frequent meals, focus on hydration, and foods that patient enjoys  Protocols used: Headache-A-AH, Information Only Call - No Triage-A-AH

## 2024-09-01 ENCOUNTER — Encounter (HOSPITAL_COMMUNITY): Payer: Self-pay

## 2024-09-01 ENCOUNTER — Ambulatory Visit (HOSPITAL_COMMUNITY)
Admission: RE | Admit: 2024-09-01 | Discharge: 2024-09-01 | Disposition: A | Discharge status: Home / Self Care | Payer: Self-pay | Source: Ambulatory Visit | Attending: Emergency Medicine | Admitting: Emergency Medicine

## 2024-09-01 ENCOUNTER — Ambulatory Visit (INDEPENDENT_AMBULATORY_CARE_PROVIDER_SITE_OTHER)

## 2024-09-01 VITALS — BP 102/65 | HR 95 | Temp 99.2°F | Resp 15 | Wt 172.4 lb

## 2024-09-01 DIAGNOSIS — M25552 Pain in left hip: Secondary | ICD-10-CM | POA: Diagnosis present

## 2024-09-01 DIAGNOSIS — R630 Anorexia: Secondary | ICD-10-CM | POA: Diagnosis present

## 2024-09-01 DIAGNOSIS — M25562 Pain in left knee: Secondary | ICD-10-CM | POA: Insufficient documentation

## 2024-09-01 DIAGNOSIS — R634 Abnormal weight loss: Secondary | ICD-10-CM | POA: Diagnosis present

## 2024-09-01 LAB — CBC WITH DIFFERENTIAL/PLATELET
Abs Immature Granulocytes: 0.04 K/uL (ref 0.00–0.07)
Basophils Absolute: 0 K/uL (ref 0.0–0.1)
Basophils Relative: 0 %
Eosinophils Absolute: 0 K/uL (ref 0.0–0.5)
Eosinophils Relative: 0 %
HCT: 38.8 % — ABNORMAL LOW (ref 39.0–52.0)
Hemoglobin: 12.1 g/dL — ABNORMAL LOW (ref 13.0–17.0)
Immature Granulocytes: 1 %
Lymphocytes Relative: 24 %
Lymphs Abs: 1.3 K/uL (ref 0.7–4.0)
MCH: 20.9 pg — ABNORMAL LOW (ref 26.0–34.0)
MCHC: 31.2 g/dL (ref 30.0–36.0)
MCV: 66.9 fL — ABNORMAL LOW (ref 80.0–100.0)
Monocytes Absolute: 0.5 K/uL (ref 0.1–1.0)
Monocytes Relative: 9 %
Neutro Abs: 3.7 K/uL (ref 1.7–7.7)
Neutrophils Relative %: 66 %
Platelets: 188 K/uL (ref 150–400)
RBC: 5.8 MIL/uL (ref 4.22–5.81)
RDW: 14.6 % (ref 11.5–15.5)
Smear Review: NORMAL
WBC: 5.5 K/uL (ref 4.0–10.5)
nRBC: 0 % (ref 0.0–0.2)

## 2024-09-01 LAB — COMPREHENSIVE METABOLIC PANEL WITH GFR
ALT: 24 U/L (ref 0–44)
AST: 37 U/L (ref 15–41)
Albumin: 3 g/dL — ABNORMAL LOW (ref 3.5–5.0)
Alkaline Phosphatase: 50 U/L (ref 38–126)
Anion gap: 11 (ref 5–15)
BUN: 9 mg/dL (ref 6–20)
CO2: 25 mmol/L (ref 22–32)
Calcium: 8.3 mg/dL — ABNORMAL LOW (ref 8.9–10.3)
Chloride: 98 mmol/L (ref 98–111)
Creatinine, Ser: 0.95 mg/dL (ref 0.61–1.24)
GFR, Estimated: >60 mL/min (ref >60)
Glucose, Bld: 97 mg/dL (ref 70–99)
Potassium: 4.1 mmol/L (ref 3.5–5.1)
Sodium: 134 mmol/L — ABNORMAL LOW (ref 135–145)
Total Bilirubin: 0.6 mg/dL (ref 0.0–1.2)
Total Protein: 7.3 g/dL (ref 6.5–8.1)

## 2024-09-01 LAB — TSH: TSH: 2.544 u[IU]/mL (ref 0.350–4.500)

## 2024-09-01 NOTE — Discharge Instructions (Signed)
 X-rays of his hip and knee did not reveal any underlying fracture, dislocation, or cause for his pain. He can alternate between 650 mg of Tylenol  and 400 mg of ibuprofen  every 6-8 hours as needed for pain. We have drawn some labs today to rule out underlying causes related to lack of appetite and weight loss.  If any of these results are concerning you will receive a phone call advising of any additional treatment. Otherwise keep his appointment with his primary care provider on 9/25 for further evaluation and management of his ongoing pain, lack of appetite, and weight loss. Make sure he is staying well-hydrated and getting plenty of rest. If he develops significantly worsening symptoms prior to this appointment on 9/25 please seek immediate medical treatment in the emergency department.

## 2024-09-01 NOTE — ED Triage Notes (Signed)
 Pt comes in with mother. Mother reports that patient hasn't been eating for over month. Reports that he will eat a couple bits and state that he is full. Pt denies any n/v, urinary problems. Reports that hasn't been having BMs very often, but has little in take. Mother with patient reports that she sat with him recently and he didn't urinate the entire time, so has decreased urine out put. Pt reports in a day will drink about 3 water bottle size of fluids.  Denies any abdominal pain. Pt also had pain in left hip and upper leg for the same amount of time that is worse with movement and weight bearing.  Mother reports that he has been seen multiple places and is on waiting list to get into a PCP but he can't keep going this long not eating, something has to be done.

## 2024-09-01 NOTE — ED Provider Notes (Signed)
 [facility]    CSN: [id number] Arrival date & time: 09/01/24  1525      History   Chief Complaint Chief Complaint  Patient presents with   other    Appt 330p    HPI Jkwon Treptow is a 23 y.o. male.   Patient presents with left hip and left knee pain that began about a month ago.  Patient states the pain is intermittent but worsens with movement and weightbearing.  Patient denies any recent falls or known injuries.  Denies numbness, weakness, or tingling that radiates down his leg.  Mother also reports concerns for decreased appetite over the last month.  Mother reports that he will get a couple bites and then states that he is full.  Patient reports that he occasionally does have some left lower abdominal pain, and reports that this comes and goes randomly and does not seem to be associated with with when he is eating or movement.  Patient denies any abdominal pain at this time.    Patient denies nausea, vomiting, diarrhea, and constipation.  Patient states that he did have a bowel movement last night that was a little runny, and reports that his bowel movements over the past month have been occasionally runny as well.  Denies blood in his stool.  Denies fever, body aches, chills, and weakness.  Mother reports that patient has lost weight and appears to be very thin compared to a month ago and is very concerned about this weight loss.  Mother reports that patient has been seen a few times now in the emergency department for migraines, but states that nothing has ever been done about concerns related to appetite and hip and knee pain.  Mother reports that she was finally able to schedule an appointment for a PCP on 9/25.  The history is provided by the patient and medical records.    Past Medical History:  Diagnosis Date   ADD (attention deficit disorder)    Migraine    Scoliosis     Patient Active Problem List   Diagnosis Date Noted   ADHD (attention deficit  hyperactivity disorder), 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   Right knee injury, initial encounter 11/09/2016   Adolescent idiopathic scoliosis of thoracic region 09/28/2016   Episodic tension-type headache, not intractable 04/12/2016   Migraine with aura and without status migrainosus 01/12/2016   Migraine without aura and without status migrainosus, not intractable 01/12/2016    Past Surgical History:  Procedure Laterality Date   CIRCUMCISION     EYE SURGERY     for sty removal   HERNIA REPAIR     UMBILICAL HERNIA REPAIR Right 05/28/2020   Procedure: LAPARASCOPIC RIGHT INGUINAL HERNIA REPAIR;  Surgeon: Chuckie Casimiro KIDD, MD;  Location: [facility] OR;  Service: Pediatrics;  Laterality: Right;       Home Medications    Prior to Admission medications   Medication Sig Start Date End Date Taking? Authorizing Provider  fluticasone (FLONASE) 50 MCG/ACT nasal spray Place 1 spray into both nostrils daily as needed for allergies or rhinitis.     [provider]  Multiple Vitamins-Minerals (ADULT GUMMY) CHEW Chew 1 tablet by mouth daily.    [provider]    Family History Family History  Problem Relation Age of Onset   Migraines Brother    Migraines Mother    Stroke Maternal Grandfather    Kidney failure Paternal Grandmother     Social History Social History   Tobacco Use   Smoking  status: Never   Smokeless tobacco: Never  Vaping Use   Vaping status: Never Used  Substance Use Topics   Alcohol use: No    Alcohol/week: 0.0 standard drinks of alcohol   Drug use: No     Allergies   Blueberry [berry]   Review of Systems Review of Systems  Per HPI  Physical Exam Triage Vital Signs ED Triage Vitals  Encounter Vitals Group     BP 09/01/24 1550 102/65     Girls Systolic BP Percentile --      Girls Diastolic BP Percentile --      Boys Systolic BP Percentile --      Boys Diastolic BP Percentile --      Pulse Rate 09/01/24 1550 95     Resp 09/01/24 1550 15      Temp 09/01/24 1550 99.2 F (37.3 C)     Temp Source 09/01/24 1550 Oral     SpO2 09/01/24 1550 96 %     Weight --      Height --      Head Circumference --      Peak Flow --      Pain Score 09/01/24 1549 2     Pain Loc --      Pain Education --      Exclude from Growth Chart --    No data found.  Updated Vital Signs BP 102/65 (BP Location: Right Arm)   Pulse 95   Temp 99.2 F (37.3 C) (Oral)   Resp 15   Wt 172 lb 6.4 oz (78.2 kg)   SpO2 96%   BMI 20.44 kg/m   Visual Acuity Right Eye Distance:   Left Eye Distance:   Bilateral Distance:    Right Eye Near:   Left Eye Near:    Bilateral Near:     Physical Exam Vitals and nursing note reviewed.  Constitutional:      General: He is awake. He is not in acute distress.    Appearance: Normal appearance. He is well-developed and well-groomed. He is not ill-appearing.  Cardiovascular:     Rate and Rhythm: Normal rate and regular rhythm.  Pulmonary:     Effort: Pulmonary effort is normal.     Breath sounds: Normal breath sounds.  Abdominal:     General: Abdomen is flat. Bowel sounds are normal. There is no distension.     Palpations: Abdomen is soft. There is no mass.     Tenderness: There is no abdominal tenderness. There is no right CVA tenderness, left CVA tenderness, guarding or rebound.     Hernia: No hernia is present.  Musculoskeletal:     Left hip: Normal. No deformity, tenderness or bony tenderness. Normal range of motion.     Left upper leg: Normal. No swelling, edema, deformity, tenderness or bony tenderness.     Left knee: Normal. No swelling, deformity or bony tenderness. Normal range of motion. No tenderness.  Skin:    General: Skin is warm and dry.  Neurological:     Mental Status: He is alert.  Psychiatric:        Behavior: Behavior is cooperative.      UC Treatments / Results  Labs (all labs ordered are listed, but only abnormal results are displayed) Labs Reviewed  COMPREHENSIVE METABOLIC PANEL  WITH GFR  CBC WITH DIFFERENTIAL/PLATELET  TSH    EKG   Radiology DG Knee Complete 4 Views Left Result Date: 09/01/2024 CLINICAL DATA:  left knee pain EXAM:  LEFT KNEE - COMPLETE 4+ VIEW COMPARISON:  None Available. FINDINGS: No evidence of fracture, dislocation, or joint effusion. No evidence of arthropathy or other focal bone abnormality. Soft tissues are unremarkable. IMPRESSION: Negative. Electronically Signed   By: Morgane  Naveau M.D.   On: 09/01/2024 17:04   DG Hip Unilat With Pelvis 2-3 Views Left Result Date: 09/01/2024 CLINICAL DATA:  221992 Left hip pain 221992 EXAM: DG HIP (WITH OR WITHOUT PELVIS) 2-3V LEFT COMPARISON:  None Available. FINDINGS: There is no evidence of hip fracture or dislocation of the left hip. Frontal view of the right hip demonstrates no acute displaced fracture or dislocation. No acute displaced fracture or diastasis of the bones of the pelvis. There is no evidence of arthropathy or other focal bone abnormality. Couple staples overlie the pelvis. IMPRESSION: Negative for acute traumatic injury. Electronically Signed   By: Morgane  Naveau M.D.   On: 09/01/2024 17:04    Procedures Procedures (including critical care time)  Medications Ordered in UC Medications - No data to display  Initial Impression / Assessment and Plan / UC Course  I have reviewed the triage vital signs and the nursing notes.  Pertinent labs & imaging results that were available during my care of the patient were reviewed by me and considered in my medical decision making (see chart for details).     Patient is overall well-appearing.  Vitals are stable.  Of note patient did have a weight decrease of 13 pounds based on weight that was recorded 12 days ago in the emergency department and reassess today.  No significant findings on exam.  Lung and heart sounds normal. Abdomen is flat, soft, and nontender. Without any significant findings noted to hip or knee as well.  X-rays ordered.   Based on my interpretation there is no acute osseous abnormality noted on x-rays.  Radiology report confirms this.  Ordered CBC, CMP, and TSH to rule out underlying causes related to lack of appetite and weight loss.  Recommended Tylenol  and ibuprofen  as needed for pain.  Discussed follow-up, return, and strict ER precautions. Final Clinical Impressions(s) / UC Diagnoses   Final diagnoses:  Pain of left hip  Acute pain of left knee  Weight loss  Lack of appetite     Discharge Instructions      X-rays of his hip and knee did not reveal any underlying fracture, dislocation, or cause for his pain. He can alternate between 650 mg of Tylenol  and 400 mg of ibuprofen  every 6-8 hours as needed for pain. We have drawn some labs today to rule out underlying causes related to lack of appetite and weight loss.  If any of these results are concerning you will receive a phone call advising of any additional treatment. Otherwise keep his appointment with his primary care provider on 9/25 for further evaluation and management of his ongoing pain, lack of appetite, and weight loss. Make sure he is staying well-hydrated and getting plenty of rest. If he develops significantly worsening symptoms prior to this appointment on 9/25 please seek immediate medical treatment in the emergency department.     ED Prescriptions   None    PDMP not reviewed this encounter.   Johnie Flaming A, NP 09/01/24 1714

## 2024-09-03 ENCOUNTER — Ambulatory Visit: Payer: Self-pay

## 2024-09-04 ENCOUNTER — Emergency Department (HOSPITAL_COMMUNITY)
Admission: EM | Admit: 2024-09-04 | Discharge: 2024-09-05 | Discharge status: Against Medical Advice | Attending: Emergency Medicine | Admitting: Emergency Medicine

## 2024-09-04 ENCOUNTER — Other Ambulatory Visit: Payer: Self-pay

## 2024-09-04 ENCOUNTER — Encounter (HOSPITAL_COMMUNITY): Payer: Self-pay

## 2024-09-04 DIAGNOSIS — R634 Abnormal weight loss: Secondary | ICD-10-CM | POA: Diagnosis not present

## 2024-09-04 DIAGNOSIS — R112 Nausea with vomiting, unspecified: Secondary | ICD-10-CM | POA: Insufficient documentation

## 2024-09-04 DIAGNOSIS — Z5321 Procedure and treatment not carried out due to patient leaving prior to being seen by health care provider: Secondary | ICD-10-CM | POA: Insufficient documentation

## 2024-09-04 DIAGNOSIS — R630 Anorexia: Secondary | ICD-10-CM | POA: Insufficient documentation

## 2024-09-04 LAB — URINALYSIS, ROUTINE W REFLEX MICROSCOPIC
Bilirubin Urine: NEGATIVE
Glucose, UA: NEGATIVE mg/dL
Hgb urine dipstick: NEGATIVE
Ketones, ur: 20 mg/dL — AB
Leukocytes,Ua: NEGATIVE
Nitrite: NEGATIVE
Protein, ur: 30 mg/dL — AB
Specific Gravity, Urine: 1.033 — ABNORMAL HIGH (ref 1.005–1.030)
pH: 6 (ref 5.0–8.0)

## 2024-09-04 LAB — COMPREHENSIVE METABOLIC PANEL WITH GFR
ALT: 22 U/L (ref 0–44)
AST: 31 U/L (ref 15–41)
Albumin: 3 g/dL — ABNORMAL LOW (ref 3.5–5.0)
Alkaline Phosphatase: 56 U/L (ref 38–126)
Anion gap: 15 (ref 5–15)
BUN: 10 mg/dL (ref 6–20)
CO2: 24 mmol/L (ref 22–32)
Calcium: 8.5 mg/dL — ABNORMAL LOW (ref 8.9–10.3)
Chloride: 98 mmol/L (ref 98–111)
Creatinine, Ser: 0.83 mg/dL (ref 0.61–1.24)
GFR, Estimated: >60 mL/min (ref >60)
Glucose, Bld: 98 mg/dL (ref 70–99)
Potassium: 4.2 mmol/L (ref 3.5–5.1)
Sodium: 137 mmol/L (ref 135–145)
Total Bilirubin: 0.9 mg/dL (ref 0.0–1.2)
Total Protein: 7.4 g/dL (ref 6.5–8.1)

## 2024-09-04 LAB — CBC WITH DIFFERENTIAL/PLATELET
Abs Immature Granulocytes: 0.05 K/uL (ref 0.00–0.07)
Basophils Absolute: 0 K/uL (ref 0.0–0.1)
Basophils Relative: 0 %
Eosinophils Absolute: 0.1 K/uL (ref 0.0–0.5)
Eosinophils Relative: 2 %
HCT: 38.5 % — ABNORMAL LOW (ref 39.0–52.0)
Hemoglobin: 11.7 g/dL — ABNORMAL LOW (ref 13.0–17.0)
Immature Granulocytes: 1 %
Lymphocytes Relative: 18 %
Lymphs Abs: 1.1 K/uL (ref 0.7–4.0)
MCH: 20.7 pg — ABNORMAL LOW (ref 26.0–34.0)
MCHC: 30.4 g/dL (ref 30.0–36.0)
MCV: 68.1 fL — ABNORMAL LOW (ref 80.0–100.0)
Monocytes Absolute: 0.6 K/uL (ref 0.1–1.0)
Monocytes Relative: 11 %
Neutro Abs: 4.2 K/uL (ref 1.7–7.7)
Neutrophils Relative %: 68 %
Platelets: 248 K/uL (ref 150–400)
RBC: 5.65 MIL/uL (ref 4.22–5.81)
RDW: 14.9 % (ref 11.5–15.5)
Smear Review: NORMAL
WBC: 6.1 K/uL (ref 4.0–10.5)
nRBC: 0 % (ref 0.0–0.2)

## 2024-09-04 NOTE — ED Triage Notes (Addendum)
 Pt came in via POV d/t not being able to eat for the last couple of weeks. States he only been able to eat a tiny bit & the last 3 days has had n/v after each attempt to hold anything down after eating. Denies abd pain or current n/v, has also been having difficulty having BMs, reports last BM was yesterday., A/Ox4.

## 2024-09-04 NOTE — ED Provider Triage Note (Signed)
 Emergency Medicine Provider Triage Evaluation Note  Zachary Castaneda , a 23 y.o. male  was evaluated in triage.  Pt complains of early satiety, nausea, vomiting, weight loss.  Reports for the past 2 or 3 days when he tries to eat something even if it is small he will throw it back up or immediately feel full.  Review of Systems  Positive: Poor appetite Negative: Fever  Physical Exam  BP 112/72   Pulse (!) 110   Temp 98.3 F (36.8 C)   Resp 14   Ht 6' 5 (1.956 m)   Wt 78 kg   SpO2 100%   BMI 20.40 kg/m  Gen:   Awake, no distress   Resp:  Normal effort  MSK:   Moves extremities without difficulty  Other:    Medical Decision Making  Medically screening exam initiated at 6:32 PM.  Appropriate orders placed.  Zachary Castaneda was informed that the remainder of the evaluation will be completed by another provider, this initial triage assessment does not replace that evaluation, and the importance of remaining in the ED until their evaluation is complete.     Shermon Warren SAILOR, PA-C 09/04/24 8167

## 2024-09-05 NOTE — ED Notes (Signed)
 Pt stated he was leaving because he has been here since last night and he is done waiting.

## 2024-09-06 ENCOUNTER — Ambulatory Visit: Payer: Self-pay

## 2024-09-06 DIAGNOSIS — R634 Abnormal weight loss: Secondary | ICD-10-CM | POA: Insufficient documentation

## 2024-09-06 NOTE — Telephone Encounter (Signed)
 Copied from CRM [id number]. Topic: Clinical - Red Word Triage >> Sep 06, 2024 10:55 AM Re[facility] ORN wrote: Red Word that prompted transfer to Nurse Triage: pt has not eating solid food in the past 3 days. Pt father Lemond on the line Reason for Disposition  [1] Continued weight loss AND [2] after medical evaluation by doctor (or NP/PA)  Answer Assessment - Initial Assessment Questions Pt went to ED yesterday for nausea/loss of appetite. Dad calling back to see if sooner new pcp appt. Dad said pt is drinking Ensure and smoothies but no food for last 3 days. Pt has history of migraines. No sooner appts. RN told dad to seek care at ED if symptoms worsen.        1. MAIN CONCERN: What is your main concern today?     No appetite 2. WEIGHT LOSS: How much weight have you lost?  (e.g., lbs., kgs.)  Over what period of time have you lost this weight?  (e.g., number of days, weeks, months, years)     30 pounds in month 3. BASELINE WEIGHT: What is your baseline or normal weight? (e.g., How much do you usually weigh?)     172 4. CAUSE: What do you think is causing the weight loss? (e.g., depression, anxiety, medicine side effect, pain, trouble swallowing, substance or alcohol use problem, eating disorder)     Not sure 5. PRIOR EVALUATION: Have you been evaluated by a doctor for your weight loss? If Yes, ask When was your last visit? What did your doctor (or NP/PA) tell you about the possible cause?     no  7. OTHER SYMPTOMS: Do you have any other symptoms? (e.g., anxiety or depression, blood in stool, breathing difficulty, diarrhea, fever, trouble swallowing)     Migraines, nausea  Protocols used: Weight Loss - Unintended-A-AH

## 2024-09-14 ENCOUNTER — Encounter: Payer: Self-pay | Admitting: Student in an Organized Health Care Education/Training Program

## 2024-09-14 ENCOUNTER — Ambulatory Visit (INDEPENDENT_AMBULATORY_CARE_PROVIDER_SITE_OTHER): Admitting: Student in an Organized Health Care Education/Training Program

## 2024-09-14 VITALS — BP 101/65 | HR 105 | Ht 78.0 in | Wt 164.0 lb

## 2024-09-14 DIAGNOSIS — R634 Abnormal weight loss: Secondary | ICD-10-CM | POA: Diagnosis not present

## 2024-09-14 DIAGNOSIS — R590 Localized enlarged lymph nodes: Secondary | ICD-10-CM | POA: Insufficient documentation

## 2024-09-14 DIAGNOSIS — R21 Rash and other nonspecific skin eruption: Secondary | ICD-10-CM | POA: Insufficient documentation

## 2024-09-14 DIAGNOSIS — B3781 Candidal esophagitis: Secondary | ICD-10-CM | POA: Diagnosis not present

## 2024-09-14 DIAGNOSIS — D563 Thalassemia minor: Secondary | ICD-10-CM | POA: Insufficient documentation

## 2024-09-14 DIAGNOSIS — D561 Beta thalassemia: Secondary | ICD-10-CM | POA: Insufficient documentation

## 2024-09-14 HISTORY — DX: Candidal esophagitis: B37.81

## 2024-09-14 NOTE — Assessment & Plan Note (Signed)
 Macular rash on both hands, could be infectious, will check RPR. Also could be inflammatory, will check ANCA titers.

## 2024-09-14 NOTE — Patient Instructions (Signed)
  VISIT SUMMARY: Today, we discussed your recent weight loss, decreased appetite, and other symptoms. We reviewed your recent hospitalization and the findings from your endoscopy. We also talked about your chronic migraines, recent rash, and your history of beta thalassemia trait.  YOUR PLAN: -UNINTENTIONAL WEIGHT LOSS AND DECREASED APPETITE: You have lost a significant amount of weight and have a reduced appetite over the past six weeks. We will do blood work to find out the cause and refer you to an oncologist to check your enlarged abdominal lymph nodes. Please try to stay hydrated and eat more calories. You can use Zofran  if you feel nauseous or are vomiting.  -ENLARGED ABDOMINAL LYMPH NODES: Your CT scan showed enlarged lymph nodes in your abdomen. We need to find out why, so we are referring you to an oncologist for further evaluation.  -FUNGAL INFECTION OF MOUTH AND ESOPHAGUS (THRUSH): You have a fungal infection in your throat, which can cause discomfort. Continue taking fluconazole 200 mg once daily as prescribed.  -RASH ON HANDS: You have developed a rash on your hands recently. We will do blood work to try to find out the cause.  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 This is influenced by your beta thalassemia trait. We will monitor your hemoglobin and iron levels.  -CHRONIC MIGRAINE: You have chronic migraines that have been happening more often. You can use Zofran  if you feel nauseous.  INSTRUCTIONS: Please follow up with the oncologist as soon as possible for further evaluation of your enlarged lymph nodes. Continue taking fluconazole as prescribed. Use Zofran  as needed for nausea. We will contact you with the results of your blood work.

## 2024-09-14 NOTE — Progress Notes (Addendum)
 New Patient Office Visit  Subjective    Patient ID: Zachary Castaneda, male    DOB: [birth year]  Age: 23 y.o. MRN: [id number]  CC:  Chief Complaint  Patient presents with   Establish Care    Blurred vision, right side pain and headaches was happneing a while ago but recently in hospital for lack of eating.  Couple weeks not eating much at all and for 2 days was vomiting when trying to eat.   Spots on hands and swelling from IV site  right arm     HPI  Discussed the use of AI scribe software for clinical note transcription with the patient, who gave verbal consent to proceed.  History of Present Illness Zachary Castaneda is a 23 year old male who presents with weight loss and decreased appetite. He is accompanied by his father.  Over the past six weeks, he has experienced significant weight loss, dropping from approximately 200 pounds to 164 pounds, accompanied by decreased appetite. He was hospitalized for two days due to weakness and dehydration, during which he received IV fluids. An upper and lower endoscopy during the hospitalization revealed a fungal infection in his throat, and he was prescribed fluconazole 200 mg once daily. Despite this treatment, he continues to have a reduced appetite, managing to eat only one to two meals a day.  He has a history of chronic migraines, which have increased in frequency over the past month, leading to emergency room visits for treatment. He previously used aspirin for migraines but is not currently on any prescribed migraine medication. His family history is significant for chronic migraines affecting his mother and one brother.  He developed a rash on his hands shortly after his hospital discharge, which has persisted for over a week. He also experiences occasional night sweats, with one recent episode occurring after his hospital stay.  His past medical history includes adolescent scoliosis, surgeries for an inflamed sty and a hernia at the  belly button, and beta thalassemia trait. He denies alcohol and marijuana use. He is a Teacher, music at [employer], lives at home with his parents, and has a girlfriend who lives out of state.    Outpatient Encounter Medications as of 09/14/2024  Medication Sig   fluconazole (DIFLUCAN) 200 MG tablet Take 2 tablets orally on day 1 and then start taking 1 tablet daily for total of 21 days   ondansetron  (ZOFRAN ) 4 MG tablet Take 4 mg by mouth.   fluticasone (FLONASE) 50 MCG/ACT nasal spray Place 1 spray into both nostrils daily as needed for allergies or rhinitis.    [DISCONTINUED] Multiple Vitamins-Minerals (ADULT GUMMY) CHEW Chew 1 tablet by mouth daily.   No facility-administered encounter medications on file as of 09/14/2024.    Past Medical History:  Diagnosis Date   ADD (attention deficit disorder)    Allergy    Anemia    Migraine    Scoliosis     Past Surgical History:  Procedure Laterality Date   CIRCUMCISION     EYE SURGERY     for sty removal   HERNIA REPAIR     UMBILICAL HERNIA REPAIR Right 05/28/2020   Procedure: LAPARASCOPIC RIGHT INGUINAL HERNIA REPAIR;  Surgeon: Chuckie Casimiro KIDD, MD;  Location: [facility];  Service: Pediatrics;  Laterality: Right;    Family History  Problem Relation Age of Onset   Migraines Brother    Migraines Mother    Diabetes Maternal Grandmother    Heart disease Maternal Grandmother  Hypertension Maternal Grandmother    Stroke Maternal Grandfather    Kidney failure Paternal Grandmother    Kidney disease Paternal Grandmother    Cancer Paternal Grandfather         Objective    BP 101/65   Pulse (!) 105   Ht 6' 6 (1.981 m)   Wt 164 lb (74.4 kg)   SpO2 100%   BMI 18.95 kg/m   Physical Exam  Gen: Anxious appearing young man, mild tremor Skin: Seborrheic dermatitis on his face.  On both palmar surfaces of his hands he has macular rashes that are nonblanching, no vesicles. Mouth: Thrush seen on the back of his tongue, no exudate  on his throat Neck: Normal thyroid , no lymphadenopathy Heart: Tachycardic, no murmur Lungs: Unlabored, clear throughout Abd: Thin, soft, no tenderness, no organomegaly, no hernia Ext: Warm, no edema, normal joints Psych: Anxious appearing, a little withdrawn appearing at first, not depressed appearing, normal speech and organized thoughts Neuro: Alert, conversational, full strength in the upper and lower extremities, normal gait and balance      Assessment & Plan:    Problem List Items Addressed This Visit       High   Weight loss - Primary (Chronic)   He has experienced unintentional weight loss and decreased appetite over six weeks. CT revealed enlarged abdominal lymph nodes with an unclear etiology, possibly inflammatory or neoplastic.  He has had upper and lower endoscopies that showed structurally normal stomach and colon, no signs of IBD, incidentally noted esophageal thrush is now being treated.  Differential diagnosis is broad and includes chronic infectious causes as well as inflammatory.  Malignancy is possible but would be rare in a person his age.  Given the constellation of rash, weight loss, thrush, will check an HIV test and RPR. Encourage hydration and caloric intake. Use Zofran  as needed for nausea and vomiting.  No issues with substance use reportedly, no alcohol or marijuana use.  Close follow-up in 2 weeks.      Relevant Orders   CBC with Differential/Platelet   Comprehensive metabolic panel with GFR   HIV Antibody (routine testing w rflx)   Hemoglobin A1c   TSH   Microalbumin / creatinine urine ratio   Urinalysis, Routine w reflex microscopic   ANCA screen with reflex titer   Rash of both hands (Chronic)   Macular rash on both hands, could be infectious, will check RPR. Also could be inflammatory, will check ANCA titers.       Relevant Orders   HIV Antibody (routine testing w rflx)   RPR   ANCA screen with reflex titer   Esophageal candidiasis (HCC)  (Chronic)   New issue seen incidentally on upper endoscopy.  I can see thrush on the back of his tongue as well.  Never history of this before.  No recent steroid use.  Portends some type of immunosuppression.  Currently on fluconazole 200 mg daily.  Checking HIV.      Relevant Medications   fluconazole (DIFLUCAN) 200 MG tablet   Other Relevant Orders   HIV Antibody (routine testing w rflx)   Abdominal lymphadenopathy (Chronic)   Very broad differential for this.  Could be inflammatory versus infectious versus granulomatous versus malignancy.  Will check HIV, RPR and EBV.  Sarcoidosis seems less likely.  Will check ANCA titers.  Risk of TB or disseminated histoplasmosis seems low, but his esophageal thrush does suggest some type of immunosuppression.  Will hold off on testing for these for  now.      Relevant Orders   CBC with Differential/Platelet   HIV Antibody (routine testing w rflx)   Epstein-Barr virus VCA antibody panel    Return in about 2 weeks (around 09/28/2024).   Cleatus Debby Specking, MD

## 2024-09-14 NOTE — Assessment & Plan Note (Signed)
 He has experienced unintentional weight loss and decreased appetite over six weeks. CT revealed enlarged abdominal lymph nodes with an unclear etiology, possibly inflammatory or neoplastic.  He has had upper and lower endoscopies that showed structurally normal stomach and colon, no signs of IBD, incidentally noted esophageal thrush is now being treated.  Differential diagnosis is broad and includes chronic infectious causes as well as inflammatory.  Malignancy is possible but would be rare in a person his age.  Given the constellation of rash, weight loss, thrush, will check an HIV test and RPR. Encourage hydration and caloric intake. Use Zofran  as needed for nausea and vomiting.  No issues with substance use reportedly, no alcohol or marijuana use.  Close follow-up in 2 weeks.

## 2024-09-14 NOTE — Assessment & Plan Note (Signed)
 New issue seen incidentally on upper endoscopy.  I can see thrush on the back of his tongue as well.  Never history of this before.  No recent steroid use.  Portends some type of immunosuppression.  Currently on fluconazole 200 mg daily.  Checking HIV.

## 2024-09-14 NOTE — Assessment & Plan Note (Signed)
 Very broad differential for this.  Could be inflammatory versus infectious versus granulomatous versus malignancy.  Will check HIV, RPR and EBV.  Sarcoidosis seems less likely.  Will check ANCA titers.  Risk of TB or disseminated histoplasmosis seems low, but his esophageal thrush does suggest some type of immunosuppression.  Will hold off on testing for these for now.

## 2024-09-19 ENCOUNTER — Ambulatory Visit: Payer: Self-pay | Admitting: Student in an Organized Health Care Education/Training Program

## 2024-09-21 LAB — MICROALBUMIN / CREATININE URINE RATIO

## 2024-09-21 LAB — CBC WITH DIFFERENTIAL/PLATELET
Absolute Lymphocytes: 838 {cells}/uL — ABNORMAL LOW (ref 850–3900)
Absolute Monocytes: 601 {cells}/uL (ref 200–950)
Basophils Absolute: 13 {cells}/uL (ref 0–200)
Basophils Relative: 0.2 %
Eosinophils Absolute: 40 {cells}/uL (ref 15–500)
Eosinophils Relative: 0.6 %
HCT: 37.4 % — ABNORMAL LOW (ref 38.5–50.0)
Hemoglobin: 11.2 g/dL — ABNORMAL LOW (ref 13.2–17.1)
MCH: 20.6 pg — ABNORMAL LOW (ref 27.0–33.0)
MCHC: 29.9 g/dL — ABNORMAL LOW (ref 32.0–36.0)
MCV: 68.9 fL — ABNORMAL LOW (ref 80.0–100.0)
Monocytes Relative: 9.1 %
Neutro Abs: 5108 {cells}/uL (ref 1500–7800)
Neutrophils Relative %: 77.4 %
Platelets: 382 Thousand/uL (ref 140–400)
RBC: 5.43 Million/uL (ref 4.20–5.80)
RDW: 16.2 % — ABNORMAL HIGH (ref 11.0–15.0)
Total Lymphocyte: 12.7 %
WBC: 6.6 Thousand/uL (ref 3.8–10.8)

## 2024-09-21 LAB — HEMOGLOBIN A1C
Hgb A1c MFr Bld: 5.8 % — ABNORMAL HIGH (ref <5.7)
Mean Plasma Glucose: 120 mg/dL
eAG (mmol/L): 6.6 mmol/L

## 2024-09-21 LAB — COMPREHENSIVE METABOLIC PANEL WITH GFR
AG Ratio: 0.9 (calc) — ABNORMAL LOW (ref 1.0–2.5)
ALT: 19 U/L (ref 9–46)
AST: 24 U/L (ref 10–40)
Albumin: 3.5 g/dL — ABNORMAL LOW (ref 3.6–5.1)
Alkaline phosphatase (APISO): 51 U/L (ref 36–130)
BUN: 8 mg/dL (ref 7–25)
CO2: 28 mmol/L (ref 20–32)
Calcium: 8.7 mg/dL (ref 8.6–10.3)
Chloride: 101 mmol/L (ref 98–110)
Creat: 0.71 mg/dL (ref 0.60–1.24)
Globulin: 3.7 g/dL (ref 1.9–3.7)
Glucose, Bld: 90 mg/dL (ref 65–99)
Potassium: 4.2 mmol/L (ref 3.5–5.3)
Sodium: 138 mmol/L (ref 135–146)
Total Bilirubin: 0.6 mg/dL (ref 0.2–1.2)
Total Protein: 7.2 g/dL (ref 6.1–8.1)
eGFR: 134 mL/min/1.73m2 (ref >=60)

## 2024-09-21 LAB — URINALYSIS, ROUTINE W REFLEX MICROSCOPIC

## 2024-09-21 LAB — RPR: RPR Ser Ql: NONREACTIVE

## 2024-09-21 LAB — EPSTEIN-BARR VIRUS VCA ANTIBODY PANEL
EBV NA IgG: 273 U/mL — ABNORMAL HIGH
EBV VCA IgG: 221 U/mL — ABNORMAL HIGH
EBV VCA IgM: <36 U/mL

## 2024-09-21 LAB — HIV ANTIBODY (ROUTINE TESTING W REFLEX)
HIV 1&2 Ab, 4th Generation: NONREACTIVE
HIV FINAL INTERPRETATION: NEGATIVE

## 2024-09-21 LAB — TSH: TSH: 1.48 m[IU]/L (ref 0.40–4.50)

## 2024-09-21 LAB — ANCA SCREEN W REFLEX TITER

## 2024-09-28 ENCOUNTER — Ambulatory Visit: Admitting: Student in an Organized Health Care Education/Training Program

## 2024-09-28 ENCOUNTER — Encounter: Payer: Self-pay | Admitting: Student in an Organized Health Care Education/Training Program

## 2024-09-28 VITALS — BP 98/69 | HR 124 | Wt 158.0 lb

## 2024-09-28 DIAGNOSIS — K5900 Constipation, unspecified: Secondary | ICD-10-CM | POA: Insufficient documentation

## 2024-09-28 DIAGNOSIS — D561 Beta thalassemia: Secondary | ICD-10-CM | POA: Diagnosis not present

## 2024-09-28 DIAGNOSIS — R634 Abnormal weight loss: Secondary | ICD-10-CM

## 2024-09-28 DIAGNOSIS — M329 Systemic lupus erythematosus, unspecified: Secondary | ICD-10-CM | POA: Insufficient documentation

## 2024-09-28 DIAGNOSIS — R21 Rash and other nonspecific skin eruption: Secondary | ICD-10-CM | POA: Diagnosis not present

## 2024-09-28 DIAGNOSIS — R590 Localized enlarged lymph nodes: Secondary | ICD-10-CM

## 2024-09-28 DIAGNOSIS — R7689 Other specified abnormal immunological findings in serum: Secondary | ICD-10-CM | POA: Diagnosis not present

## 2024-09-28 LAB — URINALYSIS, ROUTINE W REFLEX MICROSCOPIC
Leukocytes,Ua: NEGATIVE
Nitrite: NEGATIVE
Specific Gravity, Urine: 1.025 (ref 1.000–1.030)
Total Protein, Urine: 100 — AB
Urine Glucose: NEGATIVE
Urobilinogen, UA: 1 (ref 0.0–1.0)
pH: 6 (ref 5.0–8.0)

## 2024-09-28 LAB — CBC WITH DIFFERENTIAL/PLATELET
Basophils Absolute: 0 K/uL (ref 0.0–0.1)
Basophils Relative: 0.8 % (ref 0.0–3.0)
Eosinophils Absolute: 0 K/uL (ref 0.0–0.7)
Eosinophils Relative: 0.1 % (ref 0.0–5.0)
HCT: 34.7 % — ABNORMAL LOW (ref 39.0–52.0)
Hemoglobin: 10.8 g/dL — ABNORMAL LOW (ref 13.0–17.0)
Lymphocytes Relative: 12.4 % (ref 12.0–46.0)
Lymphs Abs: 0.7 K/uL (ref 0.7–4.0)
MCHC: 31.2 g/dL (ref 30.0–36.0)
MCV: 64.7 fl — ABNORMAL LOW (ref 78.0–100.0)
Monocytes Absolute: 0.5 K/uL (ref 0.1–1.0)
Monocytes Relative: 9.8 % (ref 3.0–12.0)
Neutro Abs: 4.3 K/uL (ref 1.4–7.7)
Neutrophils Relative %: 76.9 % (ref 43.0–77.0)
Platelets: 327 K/uL (ref 150.0–400.0)
RBC: 5.37 Mil/uL (ref 4.22–5.81)
RDW: 16.1 % — ABNORMAL HIGH (ref 11.5–15.5)
WBC: 5.6 K/uL (ref 4.0–10.5)

## 2024-09-28 LAB — C-REACTIVE PROTEIN: CRP: 3 mg/dL (ref 0.5–20.0)

## 2024-09-28 LAB — SEDIMENTATION RATE: Sed Rate: 88 mm/h — ABNORMAL HIGH (ref 0–15)

## 2024-09-28 MED ORDER — POLYETHYLENE GLYCOL 3350 17 GM/SCOOP PO POWD: 17.0000 g | Freq: Two times a day (BID) | ORAL | 1 refills | Status: DC | PRN

## 2024-09-28 MED ORDER — SENNA-DOCUSATE SODIUM 8.6-50 MG PO TABS: 1.0000 | ORAL_TABLET | Freq: Every day | ORAL | 2 refills | Status: DC

## 2024-09-28 NOTE — Assessment & Plan Note (Signed)
 Chronic and stable rash on both of his hands.  RPR was negative.  He is reporting some history of Raynaud's type phenomenon, so I do wonder if this is related to underlying connective tissue disease or vasculitis.

## 2024-09-28 NOTE — Patient Instructions (Signed)
  VISIT SUMMARY: Today, you were seen for severe constipation and weight loss. You have been experiencing constipation for the past few days, which has affected your ability to eat and resulted in weight loss. We discussed your recent CT scan results and your use of laxatives. Additionally, we reviewed your history of anemia, a persistent rash on your hands, and a possible autoimmune disorder.  YOUR PLAN: -CONSTIPATION: Constipation means having difficulty with bowel movements. You have been constipated for the past few days, and the current laxatives you are using are not effective. We will prescribe a stronger laxative to help relieve your symptoms.  -UNINTENTIONAL WEIGHT LOSS: You have lost 6 pounds over the past two weeks, likely because your constipation has made it difficult for you to eat. We will address the constipation to help you regain your appetite and weight.  -IRON DEFICIENCY ANEMIA: Iron deficiency anemia means you have low levels of iron in your blood, which can cause fatigue and weakness. Your hemoglobin level is 10 g/dL, which is low, and your red blood cells are smaller than normal. We will monitor your iron levels and may recommend iron supplements.  -RASH ON HANDS: You have a persistent rash on your hands that is painful with temperature changes. We will keep an eye on this and may need to investigate further if it does not improve.  -POSSIBLE AUTOIMMUNE DISORDER: An autoimmune disorder occurs when your immune system mistakenly attacks your own body. You have had a positive P-ANCA test and unusual blood test results, so we will order additional blood tests to investigate this further.  INSTRUCTIONS: Please follow the prescription for the stronger laxative as directed. We will monitor your weight and iron levels, and you should report any changes in your symptoms. Additionally, we will conduct further blood tests to investigate the possibility of an autoimmune disorder. Please  schedule a follow-up appointment in two weeks to review your progress and test results.

## 2024-09-28 NOTE — Assessment & Plan Note (Addendum)
 Some worsening microcytic anemia over the past few weeks.  The degree of microcytosis to the iron deficiency seems consistent with an underlying thalassemia.  Check hemoglobin electrophoresis

## 2024-09-28 NOTE — Assessment & Plan Note (Signed)
 I think we are narrowing down to an autoimmune condition as the cause of his multi morbid issues.  He has very elevated inflammatory markers.  Our infectious workup has been negative.  At [facility] he had a positive ANA with a 1:1280 speckled pattern.  ANCA testing could not be completed because of the very positive ANA.  I am going to follow this up with a double-stranded DNA and ENA testing.  His current symptoms and findings are consistent with acute lupus, but not yet diagnostic.  Depending on the results of the autoantibody panels, may start treatment with prednisone in the coming days.

## 2024-09-28 NOTE — Assessment & Plan Note (Signed)
 Chronic and stable abdominal lymphadenopathy of unclear etiology.  Likely related to the underlying inflammatory condition.  Testing for acute HIV was negative, RPR negative, no risk factors for TB.  We are doing autoimmune evaluation.  Lymph nodes are not easily accessible for biopsy.  Will continue the workup of the autoimmune conditions, if we make a diagnosis there and start treatment, can follow-up on these lymph nodes with reimaging in about 3 months.

## 2024-09-28 NOTE — Progress Notes (Signed)
 Established Patient Office Visit  Subjective   Patient ID: Zachary Castaneda, male    DOB: 17-Mar-2001  Age: 23 y.o. MRN: [id number]  Chief Complaint  Patient presents with   Weight Loss    2 week follow up   Patient declined vaccines     HPI  Discussed the use of AI scribe software for clinical note transcription with the patient, who gave verbal consent to proceed.  History of Present Illness [name] is a 23 year old male who presents with severe constipation and weight loss.  He has been experiencing severe constipation for the past three to four days, significantly impacting his ability to eat. He reports little to no bowel movements during this period. A recent CT scan at the emergency department was performed, and he recalls being told his intestines were full. He has been using prune juice and a tea called Smooth Move as laxatives, but these have not been effective.  He has lost six pounds over the past two weeks, dropping from 164 pounds to 158 pounds, which he attributes to his inability to eat due to constipation. Typically, he consumes two meals a day, but this has been reduced to one meal on some days. He mentions regurgitation that he swallowed back down.  No fever, but he reports occasional chills. He also mentions a persistent rash on his hands, which appears unchanged. He has a history of positive P ANCA, ANA with a speckled pattern and low hemoglobin levels, with a recent measurement of 10 grams and an MCV of 66. He experiences occasional knee pain, which he attributes to limited physical activity, and fingertip pain with temperature changes, and reports that he does change color.    Objective:     BP 98/69   Pulse (!) 124   Wt 158 lb (71.7 kg)   BMI 18.26 kg/m   Physical Exam  Gen: Tired appearing man Skin: Persistence macular rash on his hands, fairly large rash, no petechiae or purpura Heart: Mildly tachycardic, no murmurs Lungs: Unlabored,  clear throughout Abd: Thin, nondistended, nontender, normal percussion throughout Ext: Warm, no synovitis, no edema    Assessment & Plan:   Problem List Items Addressed This Visit       High   Weight loss - Primary (Chronic)   Worsening problem.  Weight is down from 164 pounds to 158 pounds over the last 2 weeks.  He is having low appetite.  I think this is most likely related to the general acute inflammation he is experiencing.  The abdominal exam is reassuring.  He reports being constipated, but has no signs of obstruction on exam.  Had another CT scan done on 10/4 at [facility] which showed some mild constipation.  I am continuing the workup for the cause of the underlying inflammatory condition, I suspect an autoimmune condition.  I think treatment of that underlying condition is ultimate what is needed to manage the weight loss.  For now I recommend continued supportive care with nutrition supplements.      Relevant Orders   CBC with Differential/Platelet   Urinalysis, Routine w reflex microscopic   Protein / creatinine ratio, urine   C-reactive protein   Sedimentation rate   ANA+ENA+DNA/DS+Scl 70+SjoSSA/B   Rash of both hands (Chronic)   Chronic and stable rash on both of his hands.  RPR was negative.  He is reporting some history of Raynaud's type phenomenon, so I do wonder if this is related to underlying connective tissue disease  or vasculitis.      Abdominal lymphadenopathy (Chronic)   Chronic and stable abdominal lymphadenopathy of unclear etiology.  Likely related to the underlying inflammatory condition.  Testing for acute HIV was negative, RPR negative, no risk factors for TB.  We are doing autoimmune evaluation.  Lymph nodes are not easily accessible for biopsy.  Will continue the workup of the autoimmune conditions, if we make a diagnosis there and start treatment, can follow-up on these lymph nodes with reimaging in about 3 months.        Low   Beta thalassemia (HCC)  (Chronic)   Some worsening microcytic anemia over the past few weeks.  The degree of microcytosis to the iron deficiency seems consistent with an underlying thalassemia.  Check hemoglobin electrophoresis      Relevant Orders   CBC with Differential/Platelet   Hgb Fractionation Cascade     Unprioritized   Constipation   Patient reports a sensation of moderate to severe constipation.  His exam is reassuring today with no distention.  So I think his constipation is more moderate.  Will start supportive care with MiraLAX and senna.  He already had colonoscopy to rule out IBD.      Relevant Medications   polyethylene glycol powder (GLYCOLAX/MIRALAX) 17 GM/SCOOP powder   sennosides-docusate sodium (SENOKOT-S) 8.6-50 MG tablet   Positive ANA (antinuclear antibody)   I think we are narrowing down to an autoimmune condition as the cause of his multi morbid issues.  He has very elevated inflammatory markers.  Our infectious workup has been negative.  At [facility] he had a positive ANA with a 1:1280 speckled pattern.  ANCA testing could not be completed because of the very positive ANA.  I am going to follow this up with a double-stranded DNA and ENA testing.  His current symptoms and findings are consistent with acute lupus, but not yet diagnostic.  Depending on the results of the autoantibody panels, may start treatment with prednisone in the coming days.      Relevant Orders   Urinalysis, Routine w reflex microscopic   Protein / creatinine ratio, urine   C-reactive protein   Sedimentation rate   ANA+ENA+DNA/DS+Scl 70+SjoSSA/B    Return in about 2 weeks (around 10/12/2024).    Cleatus Debby Specking, MD

## 2024-09-28 NOTE — Assessment & Plan Note (Signed)
 Worsening problem.  Weight is down from 164 pounds to 158 pounds over the last 2 weeks.  He is having low appetite.  I think this is most likely related to the general acute inflammation he is experiencing.  The abdominal exam is reassuring.  He reports being constipated, but has no signs of obstruction on exam.  Had another CT scan done on 10/4 at [facility] which showed some mild constipation.  I am continuing the workup for the cause of the underlying inflammatory condition, I suspect an autoimmune condition.  I think treatment of that underlying condition is ultimate what is needed to manage the weight loss.  For now I recommend continued supportive care with nutrition supplements.

## 2024-09-28 NOTE — Assessment & Plan Note (Signed)
 Patient reports a sensation of moderate to severe constipation.  His exam is reassuring today with no distention.  So I think his constipation is more moderate.  Will start supportive care with MiraLAX and senna.  He already had colonoscopy to rule out IBD.

## 2024-09-29 LAB — PROTEIN / CREATININE RATIO, URINE
Creatinine, Urine: 262 mg/dL (ref 20–320)
Protein/Creat Ratio: 477 mg/g{creat} — ABNORMAL HIGH (ref 25–148)
Protein/Creatinine Ratio: 0.477 mg/mg{creat} — ABNORMAL HIGH (ref 0.025–0.148)
Total Protein, Urine: 125 mg/dL — ABNORMAL HIGH (ref 5–25)

## 2024-09-29 LAB — ANA+ENA+DNA/DS+SCL 70+SJOSSA/B

## 2024-09-29 LAB — URINE CULTURE
MICRO NUMBER:: 17084817
Result:: NO GROWTH
SPECIMEN QUALITY:: ADEQUATE

## 2024-09-30 LAB — HGB FRACTIONATION CASCADE
Hgb A2: 2.7 % (ref 1.8–3.2)
Hgb A: 97.3 % (ref 96.4–98.8)
Hgb F: 0 % (ref 0.0–2.0)
Hgb S: 0 %

## 2024-09-30 LAB — ANA+ENA+DNA/DS+SCL 70+SJOSSA/B

## 2024-10-02 ENCOUNTER — Ambulatory Visit: Admitting: Internal Medicine

## 2024-10-02 ENCOUNTER — Ambulatory Visit: Payer: Self-pay | Admitting: Student in an Organized Health Care Education/Training Program

## 2024-10-02 DIAGNOSIS — R7689 Other specified abnormal immunological findings in serum: Secondary | ICD-10-CM

## 2024-10-03 ENCOUNTER — Other Ambulatory Visit (INDEPENDENT_AMBULATORY_CARE_PROVIDER_SITE_OTHER)

## 2024-10-03 DIAGNOSIS — R7689 Other specified abnormal immunological findings in serum: Secondary | ICD-10-CM

## 2024-10-05 ENCOUNTER — Ambulatory Visit: Payer: Self-pay | Admitting: Student in an Organized Health Care Education/Training Program

## 2024-10-05 DIAGNOSIS — M328 Other forms of systemic lupus erythematosus: Secondary | ICD-10-CM

## 2024-10-05 LAB — ANA+ENA+DNA/DS+SCL 70+SJOSSA/B
ANA Titer 1: POSITIVE — AB
ENA RNP Ab: 7.9 AI — ABNORMAL HIGH (ref 0.0–0.9)
ENA SM Ab Ser-aCnc: 2.8 AI — ABNORMAL HIGH (ref 0.0–0.9)
ENA SSA (RO) Ab: 1.8 AI — ABNORMAL HIGH (ref 0.0–0.9)
ENA SSB (LA) Ab: <0.2 AI (ref 0.0–0.9)
Scleroderma (Scl-70) (ENA) Antibody, IgG: <0.2 AI (ref 0.0–0.9)
dsDNA Ab: 4 [IU]/mL (ref 0–9)

## 2024-10-05 LAB — FANA STAINING PATTERNS: Speckled Pattern: 1:1280 {titer} — ABNORMAL HIGH

## 2024-10-05 MED ORDER — HYDROXYCHLOROQUINE SULFATE 200 MG PO TABS: 200.0000 mg | ORAL_TABLET | Freq: Every day | ORAL | 2 refills | Status: DC

## 2024-10-11 ENCOUNTER — Encounter: Payer: Self-pay | Admitting: Student in an Organized Health Care Education/Training Program

## 2024-10-12 ENCOUNTER — Encounter: Payer: Self-pay | Admitting: Student in an Organized Health Care Education/Training Program

## 2024-10-12 ENCOUNTER — Telehealth: Payer: Self-pay

## 2024-10-12 ENCOUNTER — Ambulatory Visit: Admitting: Student in an Organized Health Care Education/Training Program

## 2024-10-12 VITALS — BP 100/66 | HR 116 | Wt 156.0 lb

## 2024-10-12 DIAGNOSIS — M328 Other forms of systemic lupus erythematosus: Secondary | ICD-10-CM

## 2024-10-12 DIAGNOSIS — D563 Thalassemia minor: Secondary | ICD-10-CM

## 2024-10-12 DIAGNOSIS — L219 Seborrheic dermatitis, unspecified: Secondary | ICD-10-CM | POA: Insufficient documentation

## 2024-10-12 DIAGNOSIS — R21 Rash and other nonspecific skin eruption: Secondary | ICD-10-CM

## 2024-10-12 MED ORDER — KETOCONAZOLE 2 % EX SHAM: 1.0000 | MEDICATED_SHAMPOO | CUTANEOUS | 0 refills | Status: AC

## 2024-10-12 NOTE — Assessment & Plan Note (Signed)
 Suspected due to flaking and inflammation around the hairline and face. Prescribe ketoconazole shampoo for use on hair and face. Instruct to use the shampoo daily for 3-4 days, then twice a week.

## 2024-10-12 NOTE — Telephone Encounter (Signed)
FYI for today's visit.

## 2024-10-12 NOTE — Patient Instructions (Signed)
  VISIT SUMMARY: Today, we discussed your ongoing symptoms and concerns related to suspected lupus, including weight loss, inflammation, and skin issues. We reviewed your recent improvements and areas that still need attention. We also addressed your skin conditions and provided a treatment plan to help manage your symptoms.  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 You will start taking hydroxychloroquine 200 mg once daily to help manage your symptoms. We will refer you to a rheumatologist for further evaluation and to an ophthalmologist for a routine eye exam. Please monitor your symptoms and report any worsening, such as fever or joint pain. If your symptoms significantly worsen, we may consider a short course of prednisone. Additionally, try to increase your caloric intake to 2500-3000 calories per day to help with weight gain.  -RASH OF BOTH HANDS: The rash on your hands is likely related to suspected SLE. We will continue to monitor this as part of your overall treatment plan for lupus.  -SEBORRHEIC DERMATITIS OF FACE AND SCALP: Seborrheic dermatitis is a common skin condition that causes flaking and inflammation. You will use ketoconazole shampoo on your hair and face daily for 3-4 days, then reduce to twice a week to help manage the symptoms.  INSTRUCTIONS: Please follow up with the rheumatologist and ophthalmologist as soon as possible. Continue to monitor your symptoms and report any significant changes. Increase your caloric intake to 2500-3000 calories per day. Use the ketoconazole shampoo as directed. If you experience any new or worsening symptoms, please contact our office immediately.

## 2024-10-12 NOTE — Assessment & Plan Note (Signed)
 Patients with a very microcytic anemia.  Iron studies at [facility] showed a normal ferritin, slightly low iron saturation.  Hard to interpret these in the setting of his acute inflammation.  He reports a history of thalassemia, I did a a low hemoglobin electrophoresis which was normal consistent with alpha thalassemia minor.  I think it is reasonable for him to continue over-the-counter oral iron supplementation.  Hopefully as the inflammation improves, the anemia will also improve.

## 2024-10-12 NOTE — Telephone Encounter (Signed)
 As I was discharging patient he questioned a referral for ophthalmology?

## 2024-10-12 NOTE — Progress Notes (Addendum)
 Established Patient Office Visit  Patient ID: Zachary Castaneda, male    DOB: 29-Sep-2001  Age: 23 y.o. MRN: [id number] PCP: Jerrell Cleatus Ned, MD  Chief Complaint  Patient presents with   Weight Loss    2 week recheck  Please see pt mychart message from yesterday     Subjective:     HPI  Discussed the use of AI scribe software for clinical note transcription with the patient, who gave verbal consent to proceed.  History of Present Illness Zachary Castaneda is a 23 year old male with suspected lupus who presents for follow-up regarding weight loss and inflammation.  He has experienced weight loss, currently weighing 156 pounds, down from 158 pounds at his last visit. His eating has improved this week, but he remains concerned about weight stability. He has not yet started hydroxychloroquine due to a miscommunication regarding the prescription pickup. His condition has been under investigation due to significant inflammation, weight loss, difficulty eating, and enlarged lymph nodes. Blood tests have shown high levels of antibodies, as reported to the patient.  Swallowing has improved, with pain occurring only occasionally. Previously, swallowing was painful most days, but now it is pain-free six out of seven days. He has completed treatment for a fungal infection in the scrotum and reports no current symptoms.  He experiences back pain, particularly around the right shoulder blade, which has been slowly improving. No chest pain, mouth ulcers, or pain with deep breaths. He reports occasional joint pain in his hands and a persistent rash on his hands that is peeling. He also notes a tremor in his hands.  He has a history of eczema, which has recently flared up around his hairline and nose, presenting with flaking and inflammation. He suspects it is eczema, as he had it as a child.  He is currently taking a multivitamin and magnesium supplements. His diet has been adjusted to include  healthier options like salads and soups with lentils and spinach. He is managing his schoolwork online and has not missed any classes despite his health issues. His mood is good, though he is experiencing some stress due to upcoming midterms.     Objective:     BP 100/66   Pulse (!) 116   Wt 156 lb (70.8 kg)   BMI 18.03 kg/m   Physical Exam  Gen: Tired appearing young man Heart: Tachycardic, no murmur Lungs: Unlabored, clear without crackles Ext: Warm, no edema, no active synovitis Skin: Macular rash on his bilateral hands looks worse than it did a couple weeks ago.  No vesicles.      Assessment & Plan:   Problem List Items Addressed This Visit       High   Rash of both hands (Chronic)   Rash on both of his hands appears to be slightly worsening.  I think this is probably related to lupus.  Starting Plaquenil today, will follow-up in a few weeks.      SLE (systemic lupus erythematosus) (HCC) - Primary (Chronic)   At least intermediate probability that he has SLE as the cause of his recent hospitalizations, weight loss, and systemic inflammation.  Based on the presence of the proteinuria, positive ANA, anti-Smith antibody, and the macular rash on his hands, I think he has at least 14 points on the ACR diagnostic criteria for lupus.  Based on this and his ongoing symptoms, I decided to start him on hydroxychloroquine.  He is going to pick this up at the pharmacy today.  He is doing pretty well with no active arthritis or pleuritis, no active oral ulcers.  And his proteinuria is reasonable.  So I decided not to treat him acutely with prednisone.  I have referred him to rheumatology and the first available visit was in January.  Will treat with Plaquenil until then, and add prednisone if needed for more severe symptoms.  Will refer him to ophthalmology for routine exam while treating with Plaquenil.      Relevant Orders   Ambulatory referral to Ophthalmology     Low   Alpha  thalassemia minor trait (Chronic)   Patients with a very microcytic anemia.  Iron studies at [facility] showed a normal ferritin, slightly low iron saturation.  Hard to interpret these in the setting of his acute inflammation.  He reports a history of thalassemia, I did a a low hemoglobin electrophoresis which was normal consistent with alpha thalassemia minor.  I think it is reasonable for him to continue over-the-counter oral iron supplementation.  Hopefully as the inflammation improves, the anemia will also improve.      Seborrheic dermatitis (Chronic)   Suspected due to flaking and inflammation around the hairline and face. Prescribe ketoconazole shampoo for use on hair and face. Instruct to use the shampoo daily for 3-4 days, then twice a week.      Relevant Medications   ketoconazole (NIZORAL) 2 % shampoo (Start on 10/15/2024)   Return in about 4 weeks (around 11/09/2024).    Cleatus Debby Specking, MD [facility] at [facility]

## 2024-10-12 NOTE — Addendum Note (Signed)
 Addended by: JERRELL SOLIAN T on: 10/12/2024 05:03 PM   Modules accepted: Orders

## 2024-10-12 NOTE — Assessment & Plan Note (Signed)
 Rash on both of his hands appears to be slightly worsening.  I think this is probably related to lupus.  Starting Plaquenil today, will follow-up in a few weeks.

## 2024-10-12 NOTE — Telephone Encounter (Signed)
 Thank you. Ophthalmology referral placed for plaquenil.

## 2024-10-12 NOTE — Assessment & Plan Note (Addendum)
 At least intermediate probability that he has SLE as the cause of his recent hospitalizations, weight loss, and systemic inflammation.  Based on the presence of the proteinuria, positive ANA, anti-Smith antibody, and the macular rash on his hands, I think he has at least 14 points on the ACR diagnostic criteria for lupus.  Based on this and his ongoing symptoms, I decided to start him on hydroxychloroquine.  He is going to pick this up at the pharmacy today.  He is doing pretty well with no active arthritis or pleuritis, no active oral ulcers.  And his proteinuria is reasonable.  So I decided not to treat him acutely with prednisone.  I have referred him to rheumatology and the first available visit was in January.  Will treat with Plaquenil until then, and add prednisone if needed for more severe symptoms.  Will refer him to ophthalmology for routine exam while treating with Plaquenil.

## 2024-10-15 NOTE — Telephone Encounter (Signed)
 Patient is aware.

## 2024-10-23 ENCOUNTER — Encounter: Payer: Self-pay | Admitting: Student in an Organized Health Care Education/Training Program

## 2024-10-23 NOTE — Telephone Encounter (Signed)
 Patient states rash on his hands does appear on his feet.  Patient also states he believes the Hydroxychloroquine maybe causing headaches and is questioning if he should continue taking?

## 2024-10-24 ENCOUNTER — Ambulatory Visit: Payer: Self-pay

## 2024-10-24 NOTE — Telephone Encounter (Signed)
 FYI Only or Action Required?: FYI only for provider: appointment scheduled on 10/26/24.  Patient was last seen in primary care on 10/12/2024 by Jerrell Cleatus Ned, MD.  Called Nurse Triage reporting Flank Pain.  Symptoms began yesterday.  Interventions attempted: Rest, hydration, or home remedies and Ice/heat application.  Symptoms are: gradually improving.  Triage Disposition: See Physician Within 24 Hours  Patient/caregiver understands and will follow disposition?: Yes, but will wait    Copied from CRM #[id number]. Topic: Clinical - Red Word Triage >> Oct 24, 2024  3:20 PM Suzen RAMAN wrote: Red Word that prompted transfer to Nurse Triage: Left side pain that wrapped around to back, stomach, and groin area. Patient was communicating with CMA at office patient advised to schedule an appt. Reason for Disposition  Pain radiates into groin, scrotum  Answer Assessment - Initial Assessment Questions Additional info: Scheduled next available acute visit with PCP on 10/26/24, offered to wait list for tomorrow but patient declines wait list and would like to come in on 10/26/24, he states he will call back if symptoms worsen.    1. LOCATION: Where does it hurt? (e.g., left, right)     Left flank  2. ONSET: When did the pain start?     10/23/24 3. SEVERITY: How bad is the pain? (e.g., Scale 1-10; mild, moderate, or severe)     Mild was moderate overnight.  4. PATTERN: Does the pain come and go, or is it constant?      Intermittent able to sleep 5. CAUSE: What do you think is causing the pain?     Unsure  6. OTHER SYMPTOMS:  Do you have any other symptoms? (e.g., fever, abdomen pain, vomiting, leg weakness, burning with urination, blood in urine)     Radiates to lower back, abdomen, groin  Protocols used: Flank Pain-A-AH

## 2024-10-26 ENCOUNTER — Ambulatory Visit: Admitting: Student in an Organized Health Care Education/Training Program

## 2024-10-26 ENCOUNTER — Encounter: Payer: Self-pay | Admitting: Student in an Organized Health Care Education/Training Program

## 2024-10-26 VITALS — BP 108/69 | HR 117 | Temp 97.9°F | Resp 18 | Ht 78.0 in | Wt 155.8 lb

## 2024-10-26 DIAGNOSIS — N50812 Left testicular pain: Secondary | ICD-10-CM | POA: Insufficient documentation

## 2024-10-26 NOTE — Assessment & Plan Note (Signed)
 He experiences severe left testicular pain with tenderness, radiating to the groin. Differential diagnosis includes testicular torsion, epididymitis, or other scrotal pathology. Testicular torsion is high-risk due to pain severity. I recommended he go to the emergency department tonight for a scrotal ultrasound to rule out testicular torsion. Provided written instructions for the emergency department visit, indicating concern for testicular torsion and the need for an ultrasound.  If the ultrasound is reassuring, I would recommend treatment for epididymitis.

## 2024-10-26 NOTE — Progress Notes (Signed)
   Acute Office Visit  Patient ID: Zachary Castaneda, male    DOB: 2001-07-05, 23 y.o.   MRN: [id number]  PCP: Jerrell Cleatus Ned, MD  Chief Complaint  Patient presents with   Acute Visit    Patient presents today for abdominal pain for a week now. He reports pain level 4 out of 10. Patient denies taking any medication for the pain. Reports pain is intermittent.    Subjective:     HPI  Discussed the use of AI scribe software for clinical note transcription with the patient, who gave verbal consent to proceed.  History of Present Illness Zachary Castaneda is a 23 year old male with lupus who presents with severe left testicular pain.  He has been experiencing severe pain in his left testicle for the past two days. The pain began suddenly and is described as severe. It radiates to the groin and side, occasionally feeling like a shock. Lifting the testicle sometimes alleviates the pain, but it generally worsens with touch. No fever, vomiting, or significant changes in appetite are noted. He has lost three pounds over the past month without significant changes in eating habits. No issues with swallowing or eating due to the pain are reported.  He has a history of lupus and has been taking hydroxychloroquine (Plaquenil) consistently over the past month, except for a brief period when he stopped due to headaches. He resumed the medication a few days ago without recurrence of headaches. Previous symptoms of headaches and nausea have resolved, and he is eating better than two months ago.  He denies any recent hospital visits for abdominal pain, which he experienced in September. At that time, he had headaches and was not eating well, but these issues have since improved. He is currently managing school stress, including upcoming midterms, but does not feel particularly stressed.      Objective:    BP 108/69   Pulse (!) 117   Temp 97.9 F (36.6 C)   Resp 18   Ht 6' 6 (1.981 m)   Wt  155 lb 12.8 oz (70.7 kg)   SpO2 99%   BMI 18.00 kg/m   Physical Exam  Gen: Thin appearing man, lost another 3 pounds since I last saw him. Abd: Soft, nontender, no peritoneal signs, no rebound or guarding Genitals: Normal penis, no scrotal edema, the left testicle is exquisitely tender, unable to palpate the epididymis due to tenderness.  No erythema of the scrotum.  On standing the testicle pain only slightly improves with support.  No urethral discharge.     Assessment & Plan:   Problem List Items Addressed This Visit       Unprioritized   Left testicular pain - Primary   He experiences severe left testicular pain with tenderness, radiating to the groin. Differential diagnosis includes testicular torsion, epididymitis, or other scrotal pathology. Testicular torsion is high-risk due to pain severity. I recommended he go to the emergency department tonight for a scrotal ultrasound to rule out testicular torsion. Provided written instructions for the emergency department visit, indicating concern for testicular torsion and the need for an ultrasound.  If the ultrasound is reassuring, I would recommend treatment for epididymitis.       Return in about 4 weeks (around 11/23/2024).  Cleatus Ned Jerrell, MD [facility] at [facility]

## 2024-10-26 NOTE — Patient Instructions (Signed)
  VISIT SUMMARY: Today, you came in because of severe pain in your left testicle that started two days ago. You also mentioned losing three pounds over the past month without any significant changes in your eating habits. We discussed your symptoms and the necessary next steps to address them.  YOUR PLAN: -LEFT TESTICULAR PAIN: You are experiencing severe pain in your left testicle, which could be due to several reasons, including testicular torsion, which is a serious condition where the testicle twists and cuts off its blood supply. To rule this out, you need to go to the emergency department for a scrotal ultrasound. This imaging test will help determine the cause of your pain. Please follow the written instructions provided for your emergency department visit.  -ABNORMAL WEIGHT LOSS: You have lost 3 pounds since your last visit, but you have not experienced nausea, vomiting, or difficulty swallowing, and your eating habits are good. We will continue to monitor your weight and dietary intake to ensure there are no underlying issues.  INSTRUCTIONS: Please go to the emergency department immediately for a scrotal ultrasound to rule out testicular torsion. Follow the written instructions provided for your visit. Continue to monitor your weight and dietary intake, and report any further weight loss or changes in your eating habits at your next appointment.

## 2024-11-09 ENCOUNTER — Ambulatory Visit (INDEPENDENT_AMBULATORY_CARE_PROVIDER_SITE_OTHER): Admitting: Student in an Organized Health Care Education/Training Program

## 2024-11-09 ENCOUNTER — Encounter: Payer: Self-pay | Admitting: Student in an Organized Health Care Education/Training Program

## 2024-11-09 VITALS — BP 103/63 | HR 106 | Wt 151.0 lb

## 2024-11-09 DIAGNOSIS — R634 Abnormal weight loss: Secondary | ICD-10-CM

## 2024-11-09 DIAGNOSIS — R590 Localized enlarged lymph nodes: Secondary | ICD-10-CM | POA: Diagnosis not present

## 2024-11-09 DIAGNOSIS — M328 Other forms of systemic lupus erythematosus: Secondary | ICD-10-CM

## 2024-11-09 NOTE — Assessment & Plan Note (Signed)
 Patient with a history of incidentally noted abdominal lymphadenopathy on CT scan at [facility] in September and October.  At that time he was having issues with esophageal candidiasis, which was the only culprit we could find.  Our plan was for follow-up imaging at 3 months.  However since that time he is now having progressive unexplained weight loss.  Because of that new and worsening symptom, will get a repeat CT abdomen to look for worsening adenopathy.  If there is worsening adenopathy we may have to consider going for biopsy to rule out a lymphoproliferative disorder.

## 2024-11-09 NOTE — Progress Notes (Signed)
 Established Patient Office Visit  Patient ID: Zachary Castaneda, male    DOB: [birth year]  Age: 23 y.o. MRN: [id number] PCP: Jerrell Cleatus Ned, MD  Chief Complaint  Patient presents with   Medical Management of Chronic Issues    4 week follow up     Subjective:     HPI  Discussed the use of AI scribe software for clinical note transcription with the patient, who gave verbal consent to proceed.  History of Present Illness Zachary Castaneda is a 23 year old male with lupus who presents with foot swelling and weight loss.  He has been experiencing foot swelling and numbness in his toes over the past few weeks. The swelling is semi-constant, affecting both feet, with the right foot more swollen than the left. The numbness is localized to his toes. The swelling is not present at the time of the visit and tends to come and go.  He has experienced unexplained weight loss, dropping from 155 pounds to 151 pounds over the past two weeks despite maintaining or increasing his food intake. He typically consumes three meals a day, with substantial meals for breakfast and dinner, and snacks in between. No pain when swallowing, vomiting, or changes in bowel movements.  He has a history of lupus, currently managed with Plaquenil. He recently completed a course of antibiotics for an unspecified condition. No nausea or vomiting associated with Plaquenil use, although he did experience some nausea without vomiting.  His mood has been regular, though he is experiencing some stress related to school. He occasionally feels weak, particularly on low energy days when active, and sometimes experiences lightheadedness when standing.  He inquires about the safety of taking probiotics following his recent antibiotic use, which he has been doing without issue.     Objective:     BP 103/63   Pulse (!) 106   Wt 151 lb (68.5 kg)   SpO2 100%   BMI 17.45 kg/m   Physical Exam  Gen: Very thin appearing  man, moderately frail appearing Neck: Normal thyroid , no nodules or adenopathy Heart: Regular, no murmur Lungs: Unlabored, clear throughout Ext: Warm, no edema MSK: No active synovitis Skin: Unchanged macular rash on his bilateral palms     Assessment & Plan:   Problem List Items Addressed This Visit       High   Weight loss - Primary (Chronic)   Weight loss has been progressive and worsening over the last 2 months.  Current weight is down 151 pounds with a BMI of 17.4.  No structural issues with eating, no access to food issues.  His mood has been stable.  He reports remaining functional at home, but I suspect he will start having symptoms if he has further weight loss.,  Check labs today to look for nutrition status including CMP.  Follow-up in 2 weeks for weight recheck.      Relevant Orders   CT ABDOMEN PELVIS W CONTRAST   CBC with Differential/Platelet   Comprehensive metabolic panel with GFR   CT Chest Wo Contrast   Abdominal lymphadenopathy (Chronic)   Patient with a history of incidentally noted abdominal lymphadenopathy on CT scan at [facility] in September and October.  At that time he was having issues with esophageal candidiasis, which was the only culprit we could find.  Our plan was for follow-up imaging at 3 months.  However since that time he is now having progressive unexplained weight loss.  Because of that new and worsening symptom,  will get a repeat CT abdomen to look for worsening adenopathy.  If there is worsening adenopathy we may have to consider going for biopsy to rule out a lymphoproliferative disorder.      Relevant Orders   CT ABDOMEN PELVIS W CONTRAST   SLE (systemic lupus erythematosus) (HCC) (Chronic)   Probable diagnosis of lupus based on positive ANA, anti-Smith antibodies.  Some of the symptoms have improved since initiating Plaquenil over the last month.  I want to follow-up on some of the disease markers including the proteinuria, complement levels,  and inflammatory markers.  Still worried about unintentional weight loss.  This is not a common side effect of Plaquenil.  First available consultation with rheumatology is scheduled for January.      Relevant Orders   C-reactive protein   C3 and C4   CBC with Differential/Platelet   Comprehensive metabolic panel with GFR   Protein / creatinine ratio, urine   Sedimentation rate   Urinalysis, Routine w reflex microscopic    Cleatus Debby Specking, MD [facility] at [facility]

## 2024-11-09 NOTE — Assessment & Plan Note (Signed)
 Probable diagnosis of lupus based on positive ANA, anti-Smith antibodies.  Some of the symptoms have improved since initiating Plaquenil over the last month.  I want to follow-up on some of the disease markers including the proteinuria, complement levels, and inflammatory markers.  Still worried about unintentional weight loss.  This is not a common side effect of Plaquenil.  First available consultation with rheumatology is scheduled for January.

## 2024-11-09 NOTE — Assessment & Plan Note (Signed)
 Weight loss has been progressive and worsening over the last 2 months.  Current weight is down 151 pounds with a BMI of 17.4.  No structural issues with eating, no access to food issues.  His mood has been stable.  He reports remaining functional at home, but I suspect he will start having symptoms if he has further weight loss.,  Check labs today to look for nutrition status including CMP.  Follow-up in 2 weeks for weight recheck.

## 2024-11-12 ENCOUNTER — Other Ambulatory Visit (INDEPENDENT_AMBULATORY_CARE_PROVIDER_SITE_OTHER)

## 2024-11-12 DIAGNOSIS — R634 Abnormal weight loss: Secondary | ICD-10-CM | POA: Diagnosis not present

## 2024-11-12 DIAGNOSIS — M328 Other forms of systemic lupus erythematosus: Secondary | ICD-10-CM

## 2024-11-12 NOTE — Addendum Note (Signed)
 Addended by: JERRELL SOLIAN T on: 11/12/2024 01:03 PM   Modules accepted: Orders

## 2024-11-13 ENCOUNTER — Ambulatory Visit: Payer: Self-pay | Admitting: Student in an Organized Health Care Education/Training Program

## 2024-11-13 LAB — CBC WITH DIFFERENTIAL/PLATELET
Basophils Absolute: 0.1 K/uL (ref 0.0–0.1)
Basophils Relative: 1.1 % (ref 0.0–3.0)
Eosinophils Absolute: 0.1 K/uL (ref 0.0–0.7)
Eosinophils Relative: 2.1 % (ref 0.0–5.0)
HCT: 32.7 % — ABNORMAL LOW (ref 39.0–52.0)
Hemoglobin: 10.2 g/dL — ABNORMAL LOW (ref 13.0–17.0)
Lymphocytes Relative: 21.2 % (ref 12.0–46.0)
Lymphs Abs: 1 K/uL (ref 0.7–4.0)
MCHC: 31.1 g/dL (ref 30.0–36.0)
MCV: 66.5 fl — ABNORMAL LOW (ref 78.0–100.0)
Monocytes Absolute: 0.3 K/uL (ref 0.1–1.0)
Monocytes Relative: 5.2 % (ref 3.0–12.0)
Neutro Abs: 3.5 K/uL (ref 1.4–7.7)
Neutrophils Relative %: 70.4 % (ref 43.0–77.0)
Platelets: 441 K/uL — ABNORMAL HIGH (ref 150.0–400.0)
RBC: 4.91 Mil/uL (ref 4.22–5.81)
RDW: 19.3 % — ABNORMAL HIGH (ref 11.5–15.5)
WBC: 4.9 K/uL (ref 4.0–10.5)

## 2024-11-13 LAB — C-REACTIVE PROTEIN: CRP: 3.9 mg/dL (ref 0.5–20.0)

## 2024-11-13 LAB — COMPREHENSIVE METABOLIC PANEL WITH GFR
ALT: 12 U/L (ref 0–53)
AST: 25 U/L (ref 0–37)
Albumin: 3.4 g/dL — ABNORMAL LOW (ref 3.5–5.2)
Alkaline Phosphatase: 58 U/L (ref 39–117)
BUN: 4 mg/dL — ABNORMAL LOW (ref 6–23)
CO2: 28 meq/L (ref 19–32)
Calcium: 8.9 mg/dL (ref 8.4–10.5)
Chloride: 100 meq/L (ref 96–112)
Creatinine, Ser: 0.57 mg/dL (ref 0.40–1.50)
GFR: 139.89 mL/min (ref >60.00)
Glucose, Bld: 94 mg/dL (ref 70–99)
Potassium: 4.1 meq/L (ref 3.5–5.1)
Sodium: 135 meq/L (ref 135–145)
Total Bilirubin: 0.7 mg/dL (ref 0.2–1.2)
Total Protein: 7.4 g/dL (ref 6.0–8.3)

## 2024-11-13 LAB — URINALYSIS, ROUTINE W REFLEX MICROSCOPIC
Bilirubin Urine: NEGATIVE
Hgb urine dipstick: NEGATIVE
Ketones, ur: NEGATIVE
Leukocytes,Ua: NEGATIVE
Nitrite: NEGATIVE
RBC / HPF: NONE SEEN (ref 0–?)
Specific Gravity, Urine: 1.015 (ref 1.000–1.030)
Urine Glucose: NEGATIVE
Urobilinogen, UA: 1 (ref 0.0–1.0)
pH: 7 (ref 5.0–8.0)

## 2024-11-13 LAB — SEDIMENTATION RATE: Sed Rate: 90 mm/h — ABNORMAL HIGH (ref 0–15)

## 2024-11-14 LAB — EXTRA SPECIMEN

## 2024-11-14 LAB — PROTEIN / CREATININE RATIO, URINE
Creatinine, Urine: 238 mg/dL (ref 20–320)
Protein/Creat Ratio: 176 mg/g{creat} — ABNORMAL HIGH (ref 25–148)
Protein/Creatinine Ratio: 0.176 mg/mg{creat} — ABNORMAL HIGH (ref 0.025–0.148)
Total Protein, Urine: 42 mg/dL — ABNORMAL HIGH (ref 5–25)

## 2024-11-14 LAB — C3 AND C4
C3 Complement: 123 mg/dL (ref 82–185)
C4 Complement: 27 mg/dL (ref 15–53)

## 2024-11-21 ENCOUNTER — Telehealth: Payer: Self-pay | Admitting: Student in an Organized Health Care Education/Training Program

## 2024-11-21 NOTE — Telephone Encounter (Signed)
 Copied from CRM #[id number]. Topic: General - Other >> Nov 21, 2024  8:30 AM Eva FALCON wrote: Reason for CRM: Melissa from [facility] is calling in regards this patients CT scan for his chest and abdomen that are scheduled for tomorrow. They are needing an urgent prior authorization since the chest CT was denied. She is hoping someone can take a look at this before 12PM since the appointment is tomorrow. Call back number [phone] direct extension [phone].

## 2024-11-21 NOTE — Telephone Encounter (Signed)
 Patient is needing a Urgent Prior Auth for CT that is scheduled for tomorrow

## 2024-11-22 ENCOUNTER — Ambulatory Visit (HOSPITAL_BASED_OUTPATIENT_CLINIC_OR_DEPARTMENT_OTHER)

## 2024-11-22 ENCOUNTER — Ambulatory Visit (HOSPITAL_BASED_OUTPATIENT_CLINIC_OR_DEPARTMENT_OTHER)
Admission: RE | Admit: 2024-11-22 | Discharge: 2024-11-22 | Attending: Student in an Organized Health Care Education/Training Program

## 2024-11-22 DIAGNOSIS — R590 Localized enlarged lymph nodes: Secondary | ICD-10-CM | POA: Diagnosis present

## 2024-11-22 DIAGNOSIS — R634 Abnormal weight loss: Secondary | ICD-10-CM | POA: Insufficient documentation

## 2024-11-22 MED ORDER — IOHEXOL 300 MG/ML  SOLN
100.0000 mL | Freq: Once | INTRAMUSCULAR | Status: AC | PRN
  Administered 2024-11-22: 100 mL via INTRAVENOUS

## 2024-11-23 NOTE — Telephone Encounter (Signed)
 CT chest was denied due to lack of supporting documents. If patient can provider prior imaging reports we can resubmit to insurance for reconsideration. CT Abdomen was approved and completed. Patient can call me directly at [phone] to alert me of any additional documents/ records received and scanned into his chart. Patient is aware of the denial for the CT chest.

## 2024-11-27 ENCOUNTER — Encounter: Payer: Self-pay | Admitting: Student in an Organized Health Care Education/Training Program

## 2024-11-27 ENCOUNTER — Ambulatory Visit: Admitting: Student in an Organized Health Care Education/Training Program

## 2024-11-27 VITALS — BP 109/80 | HR 113 | Wt 150.0 lb

## 2024-11-27 DIAGNOSIS — R634 Abnormal weight loss: Secondary | ICD-10-CM

## 2024-11-27 DIAGNOSIS — L930 Discoid lupus erythematosus: Secondary | ICD-10-CM | POA: Insufficient documentation

## 2024-11-27 DIAGNOSIS — M328 Other forms of systemic lupus erythematosus: Secondary | ICD-10-CM

## 2024-11-27 NOTE — Assessment & Plan Note (Signed)
 New issue.  Over the last 2 months he has noticed increasing hair shedding in the shower.  On exam he has areas of erythema and plaques on his scalp that look like they could be discoid lupus.  I do not see any active scars currently, so hopefully we have caught this early.  Will refer to dermatology for consultation, need to confirm the diagnosis and then consider high potency intralesional or topical steroids.

## 2024-11-27 NOTE — Progress Notes (Signed)
 Established Patient Office Visit  Patient ID: Zachary Castaneda, male    DOB: December 18, 2001  Age: 23 y.o. MRN: [id number] PCP: Jerrell Cleatus Ned, MD  Chief Complaint  Patient presents with   Medical Management of Chronic Issues    4 week follow up  Swelling bilaterally in feet     Subjective:     HPI  Discussed the use of AI scribe software for clinical note transcription with the patient, who gave verbal consent to proceed.  History of Present Illness Zachary Castaneda is a 23 year old male with lupus who presents with foot swelling and gastrointestinal symptoms.  He experiences foot swelling almost every other day, with the swelling present this morning but subsiding later.  He has gastrointestinal symptoms, including a sensation of fullness and difficulty with bowel movements, which he believes may be affecting his appetite. No nausea, but his stomach has been bothering him this week.  He has a history of enlarged lymph nodes in his abdomen. 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  He is currently taking Plaquenil daily and believes it is helping. He reports experiencing less joint pain and that his headaches have stopped. He reports occasional joint pain and has noticed hair loss over the past two to three months, which he associates with lupus.  He reports difficulty sleeping, often waking up in the middle of the night and having trouble falling back asleep. No depression and states his mood is 'regular'.  He has a history of headaches, for which he has a neurology appointment scheduled. He is unsure of the specific reason for the referral but acknowledges it may be related to his lupus diagnosis.     Objective:     BP 109/80   Pulse (!) 113   Wt 150 lb (68 kg)   BMI 17.33 kg/m   Physical Exam  Gen: Thin appearing young man Skin: Areas of alopecia on his scalp, there are erythematous plaques with adherent scaling around sun exposed areas of the scalp  no obvious scarred plaques at this point. Heart: Tachycardic, no murmur Lungs: Unlabored, clear throughout Abd: Thin, soft, nontender Ext: Warm, no edema, no synovitis      Assessment & Plan:   Problem List Items Addressed This Visit       High   Weight loss (Chronic)   Chronic and stable.  Weight today 150 pounds with a BMI of 17.  He has a moderate amount of malnutrition.  We repeated a CT of the abdomen and pelvis to look for a cause of this unintentional weight loss.  In the past he had mesenteric adenopathy which has now resolved.  Weight is stabilized.  He denies mood issues preventing him from eating.  He had an issue in the past with severe esophageal candidiasis, but no symptoms of return.  I do not think there is something structural that is preventing him from eating.  Hopefully this continues to stabilize and improve as we get better control over underlying autoimmune issue.      SLE (systemic lupus erythematosus) (HCC) - Primary (Chronic)   Probable diagnosis of lupus based on 14 points in the ACR diagnostic criteria along with very positive ANA and anti-Smith antibodies.  This has not yet been confirmed with rheumatology but he does have consultation planned for 1 month.  Tolerating Plaquenil well.  We referred for eye exam which will happen in the spring.  Will continue this medication for now, and rheumatology to decide about therapy  going forward.      Discoid lupus erythematosus of scalp (Chronic)   New issue.  Over the last 2 months he has noticed increasing hair shedding in the shower.  On exam he has areas of erythema and plaques on his scalp that look like they could be discoid lupus.  I do not see any active scars currently, so hopefully we have caught this early.  Will refer to dermatology for consultation, need to confirm the diagnosis and then consider high potency intralesional or topical steroids.      Relevant Orders   Ambulatory referral to Dermatology     Return in about 3 months (around 02/25/2025).    Cleatus Debby Specking, MD [facility] at [facility]

## 2024-11-27 NOTE — Assessment & Plan Note (Signed)
 Chronic and stable.  Weight today 150 pounds with a BMI of 17.  He has a moderate amount of malnutrition.  We repeated a CT of the abdomen and pelvis to look for a cause of this unintentional weight loss.  In the past he had mesenteric adenopathy which has now resolved.  Weight is stabilized.  He denies mood issues preventing him from eating.  He had an issue in the past with severe esophageal candidiasis, but no symptoms of return.  I do not think there is something structural that is preventing him from eating.  Hopefully this continues to stabilize and improve as we get better control over underlying autoimmune issue.

## 2024-11-27 NOTE — Assessment & Plan Note (Signed)
 Probable diagnosis of lupus based on 14 points in the ACR diagnostic criteria along with very positive ANA and anti-Smith antibodies.  This has not yet been confirmed with rheumatology but he does have consultation planned for 1 month.  Tolerating Plaquenil well.  We referred for eye exam which will happen in the spring.  Will continue this medication for now, and rheumatology to decide about therapy going forward.

## 2024-11-27 NOTE — Patient Instructions (Signed)
  VISIT SUMMARY: During your visit, we discussed your ongoing symptoms related to lupus, including foot swelling, gastrointestinal issues, and hair loss. We also reviewed your current medications and upcoming specialist appointments.  YOUR PLAN: -SYSTEMIC LUPUS ERYTHEMATOSUS: Systemic lupus erythematosus is an autoimmune disease where your immune system attacks your own tissues. Continue taking Hydroxychloroquine 200 mg daily. Attend your neurology appointment on December 11th and your rheumatology appointment on January 7th. You have also been referred to a dermatologist for an evaluation of your hair loss.  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 Your condition is improving with decreased protein in your urine and better inflammatory markers. Continue to monitor your protein levels and kidney function. Use compression socks if you experience significant swelling.  -DISCOID LUPUS ERYTHEMATOSUS OF SCALP: Discoid lupus erythematosus is a chronic skin condition of sores with inflammation and scarring. You have been referred to a dermatologist for early evaluation and treatment to prevent scarring.  -SEBORRHEIC DERMATITIS: Seborrheic dermatitis is a common skin condition that causes flaking and irritation. Use a face wash three times a week to manage the symptoms.  -GENERAL HEALTH MAINTENANCE: Due to your use of Hydroxychloroquine, regular eye exams are necessary. Monitor your weight and report if it drops below 150 pounds. Consider protein shakes to help maintain your weight.  INSTRUCTIONS: Please attend your neurology appointment on December 11th and your rheumatology appointment on January 7th. You have also been referred to a dermatologist for an evaluation of your hair loss.

## 2024-11-29 ENCOUNTER — Encounter: Payer: Self-pay | Admitting: Neurology

## 2024-11-29 ENCOUNTER — Ambulatory Visit: Admitting: Neurology

## 2024-11-29 NOTE — Progress Notes (Deleted)
 [facility]  PATIENT: Zachary Castaneda DOB: 23-Oct-2001  REFERRING DOCTOR OR PCP: Zachary Kin, MD; Zachary Specking, MD SOURCE: Patient, notes from emergency room, imaging and lab reports, neurologic MRI images personally reviewed  _________________________________   HISTORICAL  CHIEF COMPLAINT:  No chief complaint on file.   HISTORY OF PRESENT ILLNESS:  ***      Imaging: MRI of the head 08/21/2024 was normal.  MR venogram of the head 08/21/2024 showed normal variant anatomy (diminutive left transverse sinus).  No evidence of thrombosis.  CT scan of the abdomen 09/06/2024 by report showed Enlarged intra-abdominal lymph nodes. These could be reactive and related to conditions such as underlying bowel disease such as celiac sprue versus other disease such as lymphoproliferative disorder.   Laboratory: 11/13/2024: Sedimentation rate was elevated at 90.  CRP was normal.  CMP was unremarkable.  Trace protein in urine  10/05/2024: ANA was positive 1:1280.  ENA RNP and ENA SM and ENA SSA antibodies were elevated  REVIEW OF SYSTEMS: Constitutional: No fevers, chills, sweats, or change in appetite Eyes: No visual changes, double vision, eye pain Ear, nose and throat: No hearing loss, ear pain, nasal congestion, sore throat Cardiovascular: No chest pain, palpitations Respiratory:  No shortness of breath at rest or with exertion.   No wheezes GastrointestinaI: No nausea, vomiting, diarrhea, abdominal pain, fecal incontinence Genitourinary:  No dysuria, urinary retention or frequency.  No nocturia. Musculoskeletal:  No neck pain, back pain Integumentary: No rash, pruritus, skin lesions Neurological: as above Psychiatric: No depression at this time.  No anxiety Endocrine: No palpitations, diaphoresis, change in appetite, change in weigh or increased thirst Hematologic/Lymphatic:  No anemia, purpura, petechiae. Allergic/Immunologic: No itchy/runny eyes, nasal  congestion, recent allergic reactions, rashes  ALLERGIES: Allergies[1]  HOME MEDICATIONS: Current Medications[2]  PAST MEDICAL HISTORY: Past Medical History:  Diagnosis Date   ADD (attention deficit disorder)    Allergy    Anemia    Esophageal candidiasis (HCC) 09/14/2024   Migraine    Scoliosis     PAST SURGICAL HISTORY: Past Surgical History:  Procedure Laterality Date   CIRCUMCISION     EYE SURGERY     for sty removal   HERNIA REPAIR     UMBILICAL HERNIA REPAIR Right 05/28/2020   Procedure: LAPARASCOPIC RIGHT INGUINAL HERNIA REPAIR;  Surgeon: Zachary Casimiro KIDD, MD;  Location: [facility] OR;  Service: Pediatrics;  Laterality: Right;    FAMILY HISTORY: Family History  Problem Relation Age of Onset   Migraines Brother    Migraines Mother    Diabetes Maternal Grandmother    Heart disease Maternal Grandmother    Hypertension Maternal Grandmother    Stroke Maternal Grandfather    Kidney failure Paternal Grandmother    Kidney disease Paternal Grandmother    Cancer Paternal Grandfather     SOCIAL HISTORY: Social History   Socioeconomic History   Marital status: Single    Spouse name: Not on file   Number of children: Not on file   Years of education: Not on file   Highest education level: 12th grade  Occupational History   Not on file  Tobacco Use   Smoking status: Never   Smokeless tobacco: Never  Vaping Use   Vaping status: Never Used  Substance and Sexual Activity   Alcohol use: No   Drug use: No   Sexual activity: Never  Other Topics Concern   Not on file  Social History Narrative   [name] is a 6th grade student at [employer]; he does very well in school. He lives with his mother and twin brothers. He enjoys basketball, running, and video games.   Social Drivers of Health   Tobacco Use: Low Risk (11/27/2024)   Patient History    Smoking Tobacco Use: Never    Smokeless Tobacco Use: Never    Passive Exposure: Not on file  Financial Resource  Strain: Low Risk (09/13/2024)   Overall Financial Resource Strain (CARDIA)    Difficulty of Paying Living Expenses: Not hard at all  Food Insecurity: No Food Insecurity (09/13/2024)   Epic    Worried About Radiation Protection Practitioner of Food in the Last Year: Never true    Ran Out of Food in the Last Year: Never true  Transportation Needs: No Transportation Needs (09/13/2024)   Epic    Lack of Transportation (Medical): No    Lack of Transportation (Non-Medical): No  Physical Activity: Inactive (09/13/2024)   Exercise Vital Sign    Days of Exercise per Week: 0 days    Minutes of Exercise per Session: Not on file  Stress: No Stress Concern Present (09/13/2024)   [employer] of Occupational Health - Occupational Stress Questionnaire    Feeling of Stress: Not at all  Social Connections: Socially Isolated (09/13/2024)   Social Connection and Isolation Panel    Frequency of Communication with Friends and Family: More than three times a week    Frequency of Social Gatherings with Friends and Family: Patient declined    Attends Religious Services: Never    Database Administrator or Organizations: No    Attends Engineer, Structural: Not on file    Marital Status: Never married  Intimate Partner Violence: Not At Risk (10/26/2024)   Received from [facility]   HITS    Over the last 12 months how often did your partner physically hurt you?: Never    Over the last 12 months how often did your partner insult you or talk down to you?: Never    Over the last 12 months how often did your partner threaten you with physical harm?: Never    Over the last 12 months how often did your partner scream or curse at you?: Never  Depression (PHQ2-9): Medium Risk (09/14/2024)   Depression (PHQ2-9)    PHQ-2 Score: 5  Alcohol Screen: Not on file  Housing: Low Risk (09/13/2024)   Epic    Unable to Pay for Housing in the Last Year: No    Number of Times Moved in the Last Year: 0    Homeless in the Last Year: No   Utilities: Not At Risk (09/06/2024)   Received from [facility]    In the past 12 months has the electric, gas, oil, or water company threatened to shut off services in your home?: No  Health Literacy: Not on file       PHYSICAL EXAM  There were no vitals filed for this visit.  There is no height or weight on file to calculate BMI.   General: The patient is well-developed and well-nourished and in no acute distress  HEENT:  Head is [place]/AT.  Sclera are anicteric.  Funduscopic exam shows normal optic discs and retinal vessels.  Neck: No carotid bruits are noted.  The neck is nontender.  Cardiovascular: The heart has a regular rate and rhythm with a normal S1 and S2. There were no murmurs, gallops or rubs.    Skin: Extremities are without rash or  edema.  Musculoskeletal:  Back is nontender  Neurologic Exam  Mental status: The patient is alert and oriented x 3 at the time of the examination. The patient has apparent normal recent and remote memory, with an apparently normal attention span and concentration ability.   Speech is normal.  Cranial nerves: Extraocular movements are full. Pupils are equal, round, and reactive to light and accomodation.  Visual fields are full.  Facial symmetry is present. There is good facial sensation to soft touch bilaterally.Facial strength is normal.  Trapezius and sternocleidomastoid strength is normal. No dysarthria is noted.  The tongue is midline, and the patient has symmetric elevation of the soft palate. No obvious hearing deficits are noted.  Motor:  Muscle bulk is normal.   Tone is normal. Strength is  5 / 5 in all 4 extremities.   Sensory: Sensory testing is intact to pinprick, soft touch and vibration sensation in all 4 extremities.  Coordination: Cerebellar testing reveals good finger-nose-finger and heel-to-shin bilaterally.  Gait and station: Station is normal.   Gait is normal. Tandem gait is normal. Romberg is negative.    Reflexes: Deep tendon reflexes are symmetric and normal bilaterally.   Plantar responses are flexor.    DIAGNOSTIC DATA (LABS, IMAGING, TESTING) - I reviewed patient records, labs, notes, testing and imaging myself where available.  Lab Results  Component Value Date   WBC 4.9 11/12/2024   HGB 10.2 (L) 11/12/2024   HCT 32.7 (L) 11/12/2024   MCV 66.5 Repeated and verified X2. (L) 11/12/2024   PLT 441.0 (H) 11/12/2024      Component Value Date/Time   NA 135 11/12/2024 1458   K 4.1 11/12/2024 1458   CL 100 11/12/2024 1458   CO2 28 11/12/2024 1458   GLUCOSE 94 11/12/2024 1458   BUN 4 (L) 11/12/2024 1458   CREATININE 0.57 11/12/2024 1458   CREATININE 0.71 09/14/2024 1443   CALCIUM 8.9 11/12/2024 1458   PROT 7.4 11/12/2024 1458   ALBUMIN 3.4 (L) 11/12/2024 1458   AST 25 11/12/2024 1458   ALT 12 11/12/2024 1458   ALKPHOS 58 11/12/2024 1458   BILITOT 0.7 11/12/2024 1458   GFRNONAA >60 09/04/2024 1830   GFRAA NOT CALCULATED 07/09/2011 1630   No results found for: CHOL, HDL, LDLCALC, LDLDIRECT, TRIG, CHOLHDL Lab Results  Component Value Date   HGBA1C 5.8 (H) 09/14/2024   No results found for: [id number] Lab Results  Component Value Date   TSH 1.48 09/14/2024       ASSESSMENT AND PLAN  ***   Giorgia Wahler A. 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

## 2024-12-22 ENCOUNTER — Encounter: Payer: Self-pay | Admitting: Student in an Organized Health Care Education/Training Program

## 2024-12-23 NOTE — Progress Notes (Deleted)
 "  Office Visit Note  Patient: Zachary Castaneda             Date of Birth: 2001-04-07           MRN: [id number]             PCP: Zachary Cleatus Ned, MD Referring: Zachary Cleatus Ned, MD Visit Date: 12/26/2024 Occupation: Data Unavailable  Subjective:  No chief complaint on file.   History of Present Illness: Zachary Castaneda is a 24 y.o. male ***     Activities of Daily Living:  Patient reports morning stiffness for *** {minute/hour:19697}.   Patient {ACTIONS;DENIES/REPORTS:21021675::Denies} nocturnal pain.  Difficulty dressing/grooming: {ACTIONS;DENIES/REPORTS:21021675::Denies} Difficulty climbing stairs: {ACTIONS;DENIES/REPORTS:21021675::Denies} Difficulty getting out of chair: {ACTIONS;DENIES/REPORTS:21021675::Denies} Difficulty using hands for taps, buttons, cutlery, and/or writing: {ACTIONS;DENIES/REPORTS:21021675::Denies}  No Rheumatology ROS completed.   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   Seborrheic dermatitis 10/12/2024   SLE (systemic lupus erythematosus) (HCC) 09/28/2024   Rash of both hands 09/14/2024   Alpha thalassemia minor trait 09/14/2024   Weight loss 09/06/2024   ADHD (attention deficit hyperactivity disorder), 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   Migraine without aura and without status migrainosus, not intractable 01/12/2016    Past Medical History:  Diagnosis Date   ADD (attention deficit disorder)    Allergy    Anemia    Esophageal candidiasis (HCC) 09/14/2024   Migraine    Scoliosis     Family History  Problem Relation Age of Onset   Migraines Brother    Migraines Mother    Diabetes Maternal Grandmother    Heart disease Maternal Grandmother    Hypertension Maternal Grandmother    Stroke Maternal Grandfather    Kidney failure Paternal Grandmother    Kidney disease Paternal Grandmother    Cancer Paternal Grandfather    Past Surgical History:   Procedure Laterality Date   CIRCUMCISION     EYE SURGERY     for sty removal   HERNIA REPAIR     UMBILICAL HERNIA REPAIR Right 05/28/2020   Procedure: LAPARASCOPIC RIGHT INGUINAL HERNIA REPAIR;  Surgeon: Zachary Casimiro KIDD, MD;  Location: [facility] OR;  Service: Pediatrics;  Laterality: Right;   Social History[1] Social History   Social History Narrative   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 He lives with his mother and twin brothers. He enjoys basketball, running, and video games.      There is no immunization history on file for this patient.   Objective: Vital Signs: There were no vitals taken for this visit.   Physical Exam   Musculoskeletal Exam: ***   Investigation: No additional findings.  Imaging: No results found.  Recent Labs: Lab Results  Component Value Date   WBC 4.9 11/12/2024   HGB 10.2 (L) 11/12/2024   PLT 441.0 (H) 11/12/2024   NA 135 11/12/2024   K 4.1 11/12/2024   CL 100 11/12/2024   CO2 28 11/12/2024   GLUCOSE 94 11/12/2024   BUN 4 (L) 11/12/2024   CREATININE 0.57 11/12/2024   BILITOT 0.7 11/12/2024   ALKPHOS 58 11/12/2024   AST 25 11/12/2024   ALT 12 11/12/2024   PROT 7.4 11/12/2024   ALBUMIN 3.4 (L) 11/12/2024   CALCIUM 8.9 11/12/2024   GFRAA NOT CALCULATED 07/09/2011    Speciality Comments: No specialty comments available.  Procedures:  No procedures performed Allergies: Blueberry [berry]   Assessment / Plan:  Visit Diagnoses:  Assessment & Plan  ***  Follow-Up Instructions: No follow-ups on file.   Lonni LELON Ester, MD  Note - This record has been created using [employer].  Chart creation errors have been sought, but may not always  have been located. Such creation errors do not reflect on  the standard of medical care.    [1]  Social History Tobacco Use   Smoking status: Never   Smokeless tobacco: Never  Vaping Use   Vaping status: Never Used  Substance  Use Topics   Alcohol use: No   Drug use: No   "

## 2024-12-24 NOTE — Consults (Signed)
 [facility]  Consultation  Inpatient consult to General Surgery Consult performed by: Keven LELON Moats, MD Consult ordered by: Franky Free, PA-C      Assessment   I personally interpreted CT imaging which shows significant nonspecific gallbladder wall thickening with periportal edema.  Continued mesenteric lymphadenopathy.  Acute transaminitis is a new finding from previous labs. Thrombocytopenia is a new finding as well.  Recommendations  Significant constellation of symptoms have been going on for some time.  This is not likely  primary cholecystitis.  He appears to have an acute hepatitis of unclear etiology.  He is going to need a GI workup--he is already established with [facility].  I went ahead and ordered an MRCP as they would likely want that as well.  He is going need a medical evaluation as well.  I discussed with ED provider.  History  History of Present Illness:  Zachary Castaneda is a 24 y.o. male who presents with Weakness The current episode started more than 1 month ago. Associated symptoms include anorexia, myalgias, a rash and weakness.  Abdominal Pain This is a chronic problem. The current episode started more than 1 month ago. The problem occurs intermittently. Associated symptoms include anorexia, constipation, myalgias and a rash. He has had the following procedures: CT scan, EGD and lower endoscopy.    Accompanied by family who provide additional history.  Family states that today he started having some confusion at home. Patient has had greater than 30 pound weight loss in the past few months.  He has been seen by [facility] recently as well.  He tells me he has trouble swallowing and pain with swallowing.  Family reports he was recently started on a lupus medication-hydroxychloroquine . General surgery asked to consult given abdominal CT imaging findings. Past Medical History:  Diagnosis Date   Lupus (systemic lupus erythematosus) (*)    No  past surgical history on file.  Allergies[1] Infusions:   LR     Medications:  PRN medications:  Prior to Admission medications  Medication Sig Start Date End Date Taking? Authorizing Provider  fluconazole (DIFLUCAN) 200 mg tablet Take 2 tablets orally on day 1 and then start taking 1 tablet daily for total of 21 days 09/13/24   Onetha JINNY Larger, MD  hydroxychloroquine  (PLAQUENIL ) 200 MG tablet Take one tablet (200 mg dose) by mouth daily. 10/05/24  Yes Historical Provider, MD  ondansetron  (ZOFRAN ) 4 mg tablet Take one tablet (4 mg dose) by mouth daily as needed for Nausea. 09/08/24 09/08/25  Yvonna ONEIDA Gust, DO    Social History[2] Family History[3] Review of Systems  Constitutional:  Positive for unexpected weight change.  HENT:  Positive for trouble swallowing.   Respiratory:  Positive for choking.   Cardiovascular:  Negative for leg swelling.  Gastrointestinal:  Positive for anorexia and constipation.  Musculoskeletal:  Positive for myalgias.  Skin:  Positive for rash.  Allergic/Immunologic: Positive for food allergies.  Neurological:  Positive for weakness.  Psychiatric/Behavioral:  Positive for confusion.     Physical Examination  Temp:  [98.5 F (36.9 C)] 98.5 F (36.9 C) Heart Rate:  [106-117] 106 Resp:  [16-20] 16 BP: (99-113)/(63-64) 113/64 SpO2:  [93 %-100 %] 93 %  Pain Score:   3 O2 Device: None (Room air) No data recorded  No intake or output data in the 24 hours ending 12/24/24 0700  Physical Exam Vitals and nursing note reviewed.  HENT:     Head: Normocephalic.  Cardiovascular:  Rate and Rhythm: Normal rate.  Musculoskeletal:        General: No swelling.  Pulmonary:     Effort: No respiratory distress.  Abdominal:     General: Abdomen is flat.     Palpations: Abdomen is soft.  Skin:    Coloration: Skin is not jaundiced.     Findings: Rash present.  Neurological:     Motor: Weakness present.    Results  Labs:  Recent Results (from  the past 24 hours)  COVID-19, Flu A+B and RSV Nasopharyngeal Swab Nasopharyngeal Swab   Collection Time: 12/24/24  7:09 PM  Result Value Ref Range   Flu A Negative Negative   Flu B Negative Negative   RSV PCR Negative Negative   SARS-COV-2 Not Detected Not Detected  CBC And Differential   Collection Time: 12/24/24  7:28 PM  Result Value Ref Range   WBC 3.9 (L) 4.0 - 10.5 thou/mcL   RBC 5.89 4.63 - 6.08 million/mcL   HGB 12.0 (L) 13.7 - 17.5 gm/dL   HCT 61.6 (L) 59.8 - 48.9 %   MCV 65.0 (L) 79.0 - 92.2 fL   MCH 20.4 (L) 25.7 - 32.2 pg   MCHC 31.3 (L) 32.3 - 36.5 gm/dL   Plt Ct 62 (L) 849 - 599 thou/mcL   RDW SD 37.2 35.1 - 46.3 fL   NRBC% 0.0 0.0 - 0.2 /100WBC   Absolute NRBC Count 0.00 0.00 - 0.01 thou/mcL  Comprehensive Metabolic Panel   Collection Time: 12/24/24  7:28 PM  Result Value Ref Range   Na 128 (L) 136 - 146 mmol/L   Potassium 5.3 3.7 - 5.4 mmol/L   Cl 97 97 - 108 mmol/L   CO2 20 20 - 32 mmol/L   AGAP 11 7 - 16 mmol/L   Glucose 91 65 - 99 mg/dL   BUN 26 (H) 6 - 20 mg/dL   Creatinine 8.71 (H) 9.23 - 1.27 mg/dL   Ca 8.0 (L) 8.7 - 89.7 mg/dL   ALK PHOS 83 25 - 849 U/L   T Bili 2.4 (H) 0.0 - 1.2 mg/dL   Total Protein 7.0 6.0 - 8.5 gm/dL   Alb 2.7 (L) 3.5 - 5.5 gm/dL   GLOBULIN 4.3 1.5 - 4.5 gm/dL   ALBUMIN/GLOBULIN RATIO 0.6 (L) 1.1 - 2.5   BUN/CREAT RATIO 20.3 11.0 - 26.0   ALT 2,544 (H) 0 - 55 U/L   AST 6,159 (H) 0 - 40 U/L   eGFR 82 >=60 mL/min/1.73m2  TSH   Collection Time: 12/24/24  7:28 PM  Result Value Ref Range   TSH 3.29 0.45 - 4.50 mcIU/mL  Magnesium   Collection Time: 12/24/24  7:28 PM  Result Value Ref Range   Mg 2.2 1.6 - 2.6 mg/dL  Ammonia   Collection Time: 12/24/24  7:28 PM  Result Value Ref Range   Ammonia 31 16 - 60 mcmol/L  Lactic Acid - Yes reflex 2 hour   Collection Time: 12/24/24  7:28 PM  Result Value Ref Range   Lactic Acid 4.4 (HH) 0.5 - 2.0 mmol/L  Procalcitonin   Collection Time: 12/24/24  7:28 PM  Result Value Ref Range    Procalcitonin 3.88 (H) 0.00 - 0.08 ng/mL  Lipase   Collection Time: 12/24/24  7:28 PM  Result Value Ref Range   Lipase 1,466 (H) 0 - 59 U/L  Manual Differential   Collection Time: 12/24/24  7:28 PM  Result Value Ref Range   Segmented Neutrophil % Man  39 %   Lymphocyte % Manual 37 %   Monocyte % Manual 3 %   Basophil % Manual 1 %   Band Neutrophil % Manual 9 %   Metamyelocytes % Manual  2 %   Variant Lymph % Manual 8 %   Absolute Neutrophil Count, Manual 1.87 1.50 - 7.50 thou/mcL   Absolute Basophil Count, Manual 0.04 0.00 - 0.20 thou/mcL   PLATELET EST Decreased (A) Adequate, Normal   POIK Many (A) (none)   ANISOCYTOSIS Few (A) (none)   OVALOCYTOSIS Few (A) (none)   ELLIPTOCYTES Moderate (A) (none)   SCHISTOCYTE Few (A) (none)   Absolute Band Neutrophil Count Manual 0.35 0.00 - 1.00 thou/mcL   Total Absolute Lymphocyte Count Manual 1.76 1.00 - 4.50 thousand/mcL   Absolute Variant Lymph Count, Manual 0.31 0.00 - 1.00 thousand/mcL   Absolute Metamyelocytes Count, Manual 0.08 (H) 0.00 thousand/mcL   Absolute Lymphocyte Count, Manual 1.44 1.00 - 4.50 thou/mcL   Absolute Monocyte Count, Manual 0.12 0.10 - 0.80 thou/mcL  Sedimentation Rate, Automated   Collection Time: 12/24/24  7:28 PM  Result Value Ref Range   ESR 18 (H) 0 - 15 mm/hr  Acetaminophen  Level   Collection Time: 12/24/24  8:49 PM  Result Value Ref Range   Acetaminophen  <5.0 (L) 10.0 - 25.0 mcg/mL  Salicylate Level   Collection Time: 12/24/24  8:49 PM  Result Value Ref Range   Salicylate <3.0 (L) 30.0 - 250.0 mcg/mL  Ethanol   Collection Time: 12/24/24  8:49 PM  Result Value Ref Range   Ethanol <10 0 mg/dL   Imaging: CT Chest Abdomen Pelvis W IV Contrast Result Date: 12/24/2024 TECHNIQUE: Axial CT chest, abdomen, and pelvis after 75cc intravenous Isovue-370 contrast. Radiation dose reduction was utilized (automated exposure control, mA or kV adjustment based on patient size, or iterative image reconstruction).  COMPARISON:  09/22/2024 INDICATION: abd pain, elevated LFTs, wt loss FINDINGS: CHEST: . Thoracic inlet/axilla: Within normal limits. . Trachea/central airways: Within normal limits. . Mediastinum/hila: Within normal limits. . Heart/pericardium: Within normal limits. . Lungs/pleura: Trace pleural fluid on the right. No consolidation. . Vascular: Within normal limits. ABDOMEN AND PELVIS: . Liver: Within normal limits. . Gallbladder and Bile ducts: Severe gallbladder wall thickening. No calcified stones. There is periportal edema without definite dilatation. . Pancreas: Within normal limits. . Spleen: Within normal limits. . Adrenals: Within normal limits. . . GI tract: No small bowel obstruction. Moderate stool. SABRA Appendix: No evidence of appendicitis . SABRA Kidneys: Within normal limits. . Ureters: Within normal limits. . Urinary bladder: Within normal limits. . Reproductive: Within normal limits. . . Vascular: Within normal limits. . . Peritoneum/Extraperitoneum: No free fluid or free air. . . Lymph Nodes: Similar appearance of mesenteric lymph nodes including 24 x 14 mm on series 3 image 470. Ileocolic node 15 x 12 mm image 499. . MUSCULOSKELETAL: Mildly heterogeneous appearance of the bones without focal lesions or fractures.   IMPRESSION: Severe, nonspecific gallbladder wall thickening, may be secondary to volume status or liver disease. There is mild periportal edema in the liver. Small volume ascites. Mild mesenteric lymphadenopathy is similar to the prior study. Trace right pleural fluid.  Electronically Signed by: Adine Fleeta Bang on 12/24/2024 10:34 PM    Electronically Signed: Keven LELON Moats, MD 12/24/2024 11:12 PM       [1] Allergies Allergen Reactions   Blueberry Anaphylaxis, Hives and Rash  [2] Social History Socioeconomic History   Marital status: Single  Tobacco Use  Smoking status: Never   Smokeless tobacco: Never  Vaping Use   Vaping status: Never Used  Substance and Sexual  Activity   Alcohol use: Not Currently   Drug use: Not Currently  [3] No family history on file.

## 2024-12-25 NOTE — H&P (Signed)
 ------------------------------------------------------------------------------- Attestation signed by Alyce Ozell Chancy, MD at 12/25/24 [phone] I have reviewed the information contained in this note and personally verified its accuracy.  Previously healthy 24 year old who has been having substantial weight loss for the past several months.  Workup thus far has been most notable for likely SLE for which she was started on Plaquenil  several months ago.  Presented with profound weakness, abdominal pain, and a single episode of hematemesis.  Labs notable for markedly elevated transaminases.  Also had a significant lactic acidosis which has since resolved.  Seen by general surgery in the ED with recommendations for MRCP and GI consult.  Right upper quadrant ultrasound findings also discussed with surgery.  Pancreatitis by ultrasound as well as by markedly elevated lipase.  Placed on broad antibiotics and IV steroids.  NPO.  MRCP has been ordered.  GI consult in the AM.  Hemodynamically stable at present but admitted to the ICU for close monitoring.  Further plans as outlined below. I have personally spent 50 minutes of critical care time in the care of this critically ill patient. This includes but is not limited to direct patient care; obtaining clinical history from non-patient resources, discussions with family members, nursing staff, consultants, ordering and interpretation of diagnostic tests including radiological images, the medical decision making process. Time spent performing and/or supervising separately billable procedures were excluded from this time. Total critical care time is 90 minutes MD + ACP Date of service 12/25/2024 -------------------------------------------------------------------------------  PULMONARY/CRITICAL CARE ADMISSION HISTORY AND PHYSICAL [facility] Date of Encounter: 12/25/2024  PATIENT INFORMATION NAME:Zachary Castaneda AGE:24  y.o. DOB:Aug 11, 2001 DZK:[id number]   PRIMARY CARE PHYSICIAN:No primary care provider on file.  Date of Admission:12/24/2024  CHIEF COMPLAINT: Weakness, nausea, abdominal pain, vomiting blood x 1  CURRENT ACTIVE HOSPITAL PROBLEMS   Principal Problem:   Lactic acidosis Active Problems:   Unintentional weight loss   Nausea with vomiting   Elevated lipase   Intra-abdominal lymphadenopathy   Microcytic anemia   Alpha thalassemia minor trait   Discoid lupus erythematosus of scalp   Migraine without aura and without status migrainosus, not intractable   Rash of both hands   SLE (systemic lupus erythematosus) (*)   Protein calorie malnutrition (*)   Elevated LFTs   AKI (acute kidney injury)   Immunocompromised (*)   Hematemesis   Pancytopenia (*)   Hyponatremia   Coagulopathy (*)   Hx esophageal candidiasis (*)   Problems Addressed Assessment/Plan     Lactic acidosis N/V, hematemesis x 1 Elevated AST>ALT & t-bili Severe gall bladder wall thickening Elevated lipase Hypoalbuminemia Intra-abdominal lymphadenopathy  Hx esophageal candidiasis on EGD 08/2024 s/p Diflucan [place] w/ [facility])  Lactic acidosis in the context of liver injury, pancytopenia, mild hypotension. Severe gall bladder wall-thickening is present on CT scan which is a new finding compared to prior imaging. Waxing/waning intra-abdominal adenopathy. Very elevated lipase w/o corollary findings on CT of pancreatitis. BP holding currently w/o pressors. Reported hematemesis x 1. None since arrival and Hgb is stable. ?AST>ALT, ?t-bili, ?platelets, ?INR, ?albumin, small ascites s/p 3L IV NS, now finishing a 4th liter   Repeat lactic acid is pending (may not return to normal w/ ?LFTs) Stat RUQ US  Hepatic function panel Acute hepatitis panel Cortisol Begin NAC protocol Begin IV PPI BID Interval CBC, CMP, lipase PRN Zofran  GI consult in AM General surgery evaluated in ED - no indication for surgery - MRCP is ordered Other  plans as outlined below    Pancytopenia Elevated PCT  Coagulopathy  Microcytic anemia Alpha thalassemia minor trait  Pancytopenia in the setting of probably SLE on Plaquenil  +/- infection +/- acute liver injury. Hgb is stable from prior, but low WBC and platelet counts are new.  Suspect coagulopathy may be caused by the liver injury. PCT 3.88. Lungs clear, no O2 need.  Peripheral smear Reticulocyte count TEG Type & screen Follow blood cultures MRSA PCR Zosyn to cover abdomen +/- bacteremia    AKI Hyponatremia Cr 1.28, normal at baseline. +Proteinuria, microscopic blood. Nothing obstructive on CT scan.  Na 125 suspect d/t poor PO intake. s/p 3L IV NS   Check CK Repeat BMP later this AM to monitor Na while on NAC (mixed in 0.45 NS) Interval lytes Strict I/Os Will try to hold off on Foley for now    Probable SLE on Plaquenil  Scalp rash Joint pains  Sept labs: ANA 1:1280 speckled, anti-Sm 51, anti-dsDNA 14.6, anti-Ro 21, RF 21, +P-ANCA. Normal anti-CCP, C3/C4 compliment  Probable SLE w/ significant lab workup, scalp rash w/ alopecia and joint pains. Seemed to be doing better on Plaquenil  per pt report but now worse. Diffuse joint pains, now with hematological and renal findings. CRP/ESR are elevated but lower compared to Sept 2025.   Re-send ANA reflex, ACNA profile Direct [place]om[place] Hold Plaquenil  Being Solumedrol 60mg  QID Attempt urgent rheumatology consult in AM    Unintentional wt loss Protein calorie malnutrition Wt loss of at least 20kg since 2024. It was 68kg 11/2024, 74kg 08/2024, down from 88kg 05/2023. The estimated wt in ED is of little use. TSH normal.  Daily measured weights Nutrition consult once cleared for POs                                         Ethics:  Full Code      Pancytopenia, due to uncertain, present on admission Plan: as above   Subjective   HPI: Zachary Castaneda is a 24 year old male with probably SLE on Plaquenil ,  migraines, unintentional weight loss, anemia, alpha thalassemia minor trait, hx esophageal candidiasis on EGD (08/2024) s/p 3 weeks Diflucan, and intra-abdominal lymphadenopathy who presents to the ED this evening for progressive weakness, abdominal pain, nausea and vomiting bloody emesis.   Initial labs showed BP 99/63, HR 117, O2 sat 100% on RA, RR 20, temp 98.24F.  Workup in the ED showed lactic acidosis, pancytopenia, elevated PCT, coagulopathy, AKI, hyponatremia, severely elevated AST >ALT, elevated ESR/CRP, elevated lipase.  CT CAP showed severe nonspecific gallbladder wall thickening, mild periportal edema in the liver, small volume ascites, mild mesenteric lymphadenopathy (compared to 09/22/24), trace right pleural effusion. No pancreatitis.  He received 3L IV NS and empiric Zosyn.  Lactic acid remains elevated thus PCCM has been consulted for admission.  Pt is A&O x 3 and able to provide a history.  His father is at bedside and provides additional history.  He has had progressing nausea, weakness, and generalized abdominal pain that is worse in the epigastric area for the last few days.  This afternoon around 4 PM had a single episode of bloody emesis. He normally has pain in the joints of his knees but now has generalized joint pain.  Denies melena, hematochezia, diarrhea, fever.  Has had chronic constipation.  Labs: 3.9 (10 on 11/7), Hgb 12 (10.1), MCV 65, MCHC 31, platelets 62 (430). Na 128 (141), K 5.3, CO2 20, BUN 26, Cr  1.28 (0.69), glucose 91, corrected Ca 9.0, albumin 2.7. Mg 2.2. AST 6159 (54), ALT 2544 (23), ALP 83 (72) t-bili 2.4 (0.5.). Lipase 1466 (56).  PT/INR 22.8/2.0.   ESR 18 (51 in Sept), CRP 5.20 (41.80 in Sept).  Lactic acid 4.4->4.9.  Normal ammonia, TSH.  UA protein 300, bili 1, blood 1.0, RBC 4, WBC 8, rare bacteria, granular casts 1.   Pt has had issues with nausea, early satiety w/ poor PO intake, and unintentional weight loss since about August 2025. After a fairly extensive  workup he was found to have probable SLE (based on labs, rash on head, joint aches), microcytic anemia, esophageal candidiasis (on EGD) and elevated lipase. Completed 3 weeks of Diflucan. He was started on Plaquenil  and established with PCP. Plans are for rheumatology visit to confirm the diagnosis of SLE which is scheduled for tomorrow (12/26/24).   Last saw his PCP (with [facility]) on 12/9 at which point his weight was 68kg. This was down from 74.4kg in September.  Weighed 88.9kg in June 2024.   General surgery was consulted in the ED [facility]). They did not feel gall bladder findings represented acute cholecystitis and recommended GI consult in AM and MRCP (which was ordered).   Patient Active Problem List   Diagnosis Date Noted   Protein calorie malnutrition (*) 12/25/2024   Lactic acidosis 12/25/2024   Elevated LFTs 12/25/2024   AKI (acute kidney injury) 12/25/2024   Immunocompromised (*) 12/25/2024   Hematemesis 12/25/2024   Pancytopenia (*) 12/25/2024   Hyponatremia 12/25/2024   Coagulopathy (*) 12/25/2024   Discoid lupus erythematosus of scalp 11/27/2024   Seborrheic dermatitis 10/12/2024   SLE (systemic lupus erythematosus) (*) 09/28/2024   Alpha thalassemia minor trait 09/14/2024   Rash of both hands 09/14/2024   Early satiety 09/06/2024   Unintentional weight loss 09/06/2024   Nausea with vomiting 09/06/2024   Elevated lipase 09/06/2024   Intra-abdominal lymphadenopathy 09/06/2024   Abnormal finding on GI tract imaging 09/06/2024   Microcytic anemia 09/06/2024   Hx esophageal candidiasis (*) 08/2024   ADHD (attention deficit hyperactivity disorder), 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   Adolescent idiopathic scoliosis of thoracic region 09/28/2016   Migraine without aura and without status migrainosus, not intractable 01/12/2016   Past Medical History:  Diagnosis Date   Lupus (systemic lupus erythematosus) (*)    No past surgical history on file. Social  History[1] Family History[2] Allergies[3]    There is no immunization history on file for this patient.  Past Medical History, Past Surgery History, Social History, and Family History, Allergies, and Immunizations were reviewed and updated.    REVIEW OF SYSTEMS: .Pertinent items are noted in HPI.  MEDICATIONS: Home Medications:  Prior to Admission medications  Medication Sig Start Date End Date Taking? Authorizing Provider  fluconazole (DIFLUCAN) 200 mg tablet Take 2 tablets orally on day 1 and then start taking 1 tablet daily for total of 21 days 09/13/24   Onetha JINNY Larger, MD  hydroxychloroquine  (PLAQUENIL ) 200 MG tablet Take one tablet (200 mg dose) by mouth daily. 10/05/24  Yes Historical Provider, MD  ketoconazole  (NIZORAL ) 2% shampoo Apply topically 2 (two) times a week.   Yes Historical Provider, MD  ondansetron  (ZOFRAN ) 4 mg tablet Take one tablet (4 mg dose) by mouth daily as needed for Nausea. 09/08/24 09/08/25  Yvonna ONEIDA Gust, DO   Scheduled Medications:  acetylcysteine (ACETADOTE) 9,180 mg in NaCl 0.45 % 245.9 mL infusion  150 mg/kg IntraVENous ONCE   Followed by  acetylcysteine (ACETADOTE) 3,060 mg in NaCl 0.45 % 515.3 mL infusion  50 mg/kg IntraVENous ONCE   Followed by   acetylcysteine (ACETADOTE) 6,400 mg in NaCl 0.45 % 1,032 mL infusion  104.7 mg/kg IntraVENous ONCE   Followed by   NOREEN ON 12/26/2024] acetylcysteine (ACETADOTE) 6,400 mg in NaCl 0.45 % 1,032 mL infusion  104.7 mg/kg IntraVENous ONCE   Followed by   NOREEN ON 12/26/2024] acetylcysteine (ACETADOTE) 6,400 mg in NaCl 0.45 % 1,032 mL infusion  104.7 mg/kg IntraVENous ONCE   Followed by   NOREEN ON 12/27/2024] acetylcysteine (ACETADOTE) 6,400 mg in NaCl 0.45 % 1,032 mL infusion  104.7 mg/kg IntraVENous ONCE   methylPREDNISolone sodium succinate  60 mg IntraVENous Q6H SCH   pantoprazole   40 mg IntraVENous Daily   piperacillin-tazabactam  3.375 g IntraVENous Q8H SCH   piperacillin-tazabactam  4.5 g  IntraVENous ONCE   Continuous Infusions:  LR 125 mL/hr (12/24/24 2340)   PRN Medications:.    Objective   VITAL SIGNS: INTAKE/OUTPUT  Temp:  [98.5 F (36.9 C)] 98.5 F (36.9 C) Heart Rate:  [106-117] 106 Resp:  [16-20] 16 BP: (99-113)/(63-64) 113/64 SpO2:  [93 %-100 %] 93 %  Weight: 61.2 kg (135 lb)   RESPIRATORY: O2 Device: None (Room air)  No data recorded O2 Device: None (Room air)    SpO2: 93 %  Intake/Output Summary (Last 24 hours) at 12/25/2024 0257 Last data filed at 12/25/2024 0132 Gross per 24 hour  Intake 3000 ml  Output --  Net 3000 ml    No intake/output data recorded.  I/O this shift: In: 3000 [IV Piggyback:3000] Out: -    HEMODYNAMICS    PHYSICAL EXAM Constitutional: Well developed, thin-appearing, in no acute distress  HENT: Normocephalic, atraumatic. PERRL. Anicteric. Mucous membranes moist Pulm: Regular, even, unlabored. Lung sounds clear CV: Sinus tachycardia. Normal S1, S2.  PV: No peripheral edema. Peripheral pulses +3. Cap refill < 3 seconds GI/GU: Bowel sounds normoactive. Abdomen soft, flat. Generalized tenderness to palpation, worse in epigastrium MS: joints diffusely tender Skin: Warm, dry. Alopecia, ?rash on scalp Neuro: A&O x 3 Psych: Normal mood and affect  Data   Culture Source Culture Data Antibiotics Day #  Treatment Goal                      LABS:  Recent Labs    Units 12/24/24 1928  WBC thou/mcL 3.9*  HGB gm/dL 87.9*  HCT % 61.6*  PLT thou/mcL 62*       No results for input(s): BLAST in the last 168 hours.  Invalid input(s): SEGSABS, BANDABS    Recent Labs    Units 12/25/24 0147 12/24/24 2340 12/24/24 1928  NA mmol/L  --   --  128*  K mmol/L  --   --  5.3  CL mmol/L  --   --  97  CO2 mmol/L  --   --  20  BUN mg/dL  --   --  26*  CREATININE mg/dL  --   --  8.71*  GLUCOSE mg/dL  --   --  91  CALCIUM mg/dL  --   --  8.0*  PHOS mg/dL  --  3.9  --   AST U/L  --  5,826* 6,159*  ALT U/L  --   2,248* 2,544*  ALKPHOS U/L  --  79 83  BILITOT mg/dL  --  2.4* 2.4*  PROTEIN gm/dL  --  6.5 7.0  ALBUMIN gm/dL  --  2.5*  2.7*  PROCALCITONI ng/mL  --   --  3.88*  CORTISOL mcg/dL  --  87.29  --   LACTACID mmol/L 2.8* 4.9* 4.4*  AMMONIA mcmol/L  --   --  31    Estimated Creatinine Clearance: 79 mL/min (A) (by C-G formula based on SCr of 1.28 mg/dL (H)).  No results for input(s): CK, TROPONIN in the last 168 hours.  Invalid input(s): MB  Recent Labs    Units 12/24/24 1928  LIPASE U/L 1,466*    Recent Labs    Units 12/24/24 2340  PT second(s) 22.8*  INR  2.0    Recent Results (from the past 72 hours)  Urinalysis Only w/ Reflex Microscopic - No Symptoms   Collection Time: 12/24/24 10:43 PM  Result Value Ref Range   Urine Color Yellow Yellow    Urine Clarity Cloudy (A) Clear   Urine Specific Gravity 1.035 (H) 1.005 - 1.030   Urine pH 6.0 5 to 9   Urine Protein - Dipstick 300 (A) Negative mg/dl   Urine Glucose Negative Negative mg/dL   Urine Ketones Negative Negative mg/dl   Urine Bilirubin 1 (A) Negative mg/dL   Urine Blood 1.0 (A) Negative mg/dL   Urine Nitrite Negative Negative   Urine Urobilinogen <2 <2 mg/dl   Urine Leukocyte Esterase Negative Negative Leu/mcL   Urine Squamous Epithelial Cells 1 <=2 /HPF   Urine WBC 8 (H) <=2 /HPF   Urine RBC 4 (H) <=2 /HPF   Urine Bacteria Rare (A) None Seen /HPF   Urine Mucous Rare (A) None /HPF   Urine Granular Casts 1 (H) 0 /LPF   UA Microscopic Yes Micro (A) No Micro    OTHER LABS:   ABG: No results found for this or any previous visit (from the past 72 hours).  Blood Gas Results (Last 72 Hours) No results found for: PH ART, PCO2 ART, PO2 ART, HCO3 ART, BASE EXC ART, O2 SAT ART, PH VEN, PO2 VEN, PCO2 VEN, HCO3 VEN, BASE EXC VEN, O2 SAT VEN  New Microbiological Data in Last 72 hrs: Microbiology Results     Procedure Component Value Units Date/Time   MRSA PCR Nares Nares [[id number]]  Collected: 12/25/24 0242   Order Status: Sent Specimen: Nares    COVID-19, Flu A+B and RSV Nasopharyngeal Swab Nasopharyngeal Swab [[id number]]  (Normal) Collected: 12/24/24 1909   Order Status: Completed Specimen: Nasopharyngeal Swab Updated: 12/24/24 2051    Flu A Negative    Flu B Negative    RSV PCR Negative    SARS-COV-2 Not Detected   Narrative:     SARS-COV-2 (COVID-19)PCR-Negative results do not preclude SARS-CoV-2 infection and should not be used as the sole basis for patient management decisions. Negative results must be combined with clinical observations, patient history, and epidemiological information.  Flu and/or RSV - Negative results do not preclude the presence of Flu or RSV virus and should not be used as the sole basis for treatment or other patient management decisions. False negative results may occur if virus is present at levels below the analytical limit of detection.  This test detects Influenza A, Influenza B, and Respiratory Syncytial Virus and SARS-COV-2 (COVID-19) by PCR.      Respiratory Panel Nasopharyngeal Swab Nasopharyngeal Swab [[id number]] Collected: 12/24/24 1909   Order Status: Resulted Specimen: Nasopharyngeal Swab Updated: 12/25/24 0222       New Radiographic Data in Last 3 days: CT Chest Abdomen Pelvis W IV Contrast Result Date: 12/24/2024 TECHNIQUE: Axial CT chest,  abdomen, and pelvis after 75cc intravenous Isovue-370 contrast. Radiation dose reduction was utilized (automated exposure control, mA or kV adjustment based on patient size, or iterative image reconstruction). COMPARISON:  09/22/2024 INDICATION: abd pain, elevated LFTs, wt loss FINDINGS: CHEST: . Thoracic inlet/axilla: Within normal limits. . Trachea/central airways: Within normal limits. . Mediastinum/hila: Within normal limits. . Heart/pericardium: Within normal limits. . Lungs/pleura: Trace pleural fluid on the right. No consolidation. . Vascular: Within normal limits. ABDOMEN AND  PELVIS: . Liver: Within normal limits. . Gallbladder and Bile ducts: Severe gallbladder wall thickening. No calcified stones. There is periportal edema without definite dilatation. . Pancreas: Within normal limits. . Spleen: Within normal limits. . Adrenals: Within normal limits. . . GI tract: No small bowel obstruction. Moderate stool. SABRA Appendix: No evidence of appendicitis . SABRA Kidneys: Within normal limits. . Ureters: Within normal limits. . Urinary bladder: Within normal limits. . Reproductive: Within normal limits. . . Vascular: Within normal limits. . . Peritoneum/Extraperitoneum: No free fluid or free air. . . Lymph Nodes: Similar appearance of mesenteric lymph nodes including 24 x 14 mm on series 3 image 470. Ileocolic node 15 x 12 mm image 499. . MUSCULOSKELETAL: Mildly heterogeneous appearance of the bones without focal lesions or fractures.   IMPRESSION: Severe, nonspecific gallbladder wall thickening, may be secondary to volume status or liver disease. There is mild periportal edema in the liver. Small volume ascites. Mild mesenteric lymphadenopathy is similar to the prior study. Trace right pleural fluid.  Electronically Signed by: Adine Fleeta Bang on 12/24/2024 10:34 PM   RADIOGRAPHIC STUDIES PERSONALLY REVIEWED  Disposition ICU  Discussed plan of care with Pt, father   RECORD REVIEW old medical records. previous electrocardiograms. nursing notes. previous radiology studies. labs medicine/allergy list  This note was dictated with voice recognition software. Similar sounding words can inadvertently be transcribed and may not be corrected upon review.  I personally spent 40 minutes in critical care time.  I personally examined the patient reviewing the hemodynamic and laboratory data.  I furthermore reviewed current and previous imaging studies, reviewed current and previous telemetry data, reviewed the patient's EKG and/or echocardiogram, reviewed current microbiological data and  antibiotics, reviewed pressor requirements and made necessary adjustments, reviewed current respiratory requirements and made necessary adjustments, reviewed current electrolytes and managed as indicated, reviewed intake and output and optimized patient's volume status, and assessed patient's mentation and adjusted medications as needed.  I subsequently shared in the development of a comprehensive management plan and have serially assessed the patient's response to the critical care interventions.   ELECTRONICALLY SIGNED Olen Allen, NP  12/25/2024 2:57 AM       [1] Social History Socioeconomic History   Marital status: Single  Tobacco Use   Smoking status: Never   Smokeless tobacco: Never  Vaping Use   Vaping status: Never Used  Substance and Sexual Activity   Alcohol use: Not Currently   Drug use: Not Currently  [2] No family history on file. [3] Allergies Allergen Reactions   Blueberry Anaphylaxis, Hives and Rash

## 2024-12-26 ENCOUNTER — Ambulatory Visit: Payer: Self-pay | Admitting: Internal Medicine

## 2025-01-04 ENCOUNTER — Ambulatory Visit: Payer: Self-pay | Attending: Internal Medicine | Admitting: Internal Medicine

## 2025-01-04 ENCOUNTER — Encounter: Payer: Self-pay | Admitting: Internal Medicine

## 2025-01-04 ENCOUNTER — Encounter: Payer: Self-pay | Admitting: Student in an Organized Health Care Education/Training Program

## 2025-01-04 VITALS — BP 120/81 | HR 103 | Temp 99.1°F | Resp 14 | Ht 79.0 in | Wt 165.0 lb

## 2025-01-04 DIAGNOSIS — L97519 Non-pressure chronic ulcer of other part of right foot with unspecified severity: Secondary | ICD-10-CM | POA: Insufficient documentation

## 2025-01-04 DIAGNOSIS — R21 Rash and other nonspecific skin eruption: Secondary | ICD-10-CM | POA: Diagnosis present

## 2025-01-04 DIAGNOSIS — L97509 Non-pressure chronic ulcer of other part of unspecified foot with unspecified severity: Secondary | ICD-10-CM | POA: Insufficient documentation

## 2025-01-04 DIAGNOSIS — L97501 Non-pressure chronic ulcer of other part of unspecified foot limited to breakdown of skin: Secondary | ICD-10-CM | POA: Insufficient documentation

## 2025-01-04 DIAGNOSIS — M328 Other forms of systemic lupus erythematosus: Secondary | ICD-10-CM | POA: Insufficient documentation

## 2025-01-04 DIAGNOSIS — D696 Thrombocytopenia, unspecified: Secondary | ICD-10-CM | POA: Diagnosis present

## 2025-01-04 DIAGNOSIS — L930 Discoid lupus erythematosus: Secondary | ICD-10-CM | POA: Insufficient documentation

## 2025-01-04 MED ORDER — GABAPENTIN 100 MG PO CAPS: 100.0000 mg | ORAL_CAPSULE | Freq: Two times a day (BID) | ORAL | 0 refills | Status: AC

## 2025-01-04 MED ORDER — TRAMADOL HCL 50 MG PO TABS: 50.0000 mg | ORAL_TABLET | Freq: Four times a day (QID) | ORAL | 0 refills | Status: DC | PRN

## 2025-01-04 MED ORDER — AMLODIPINE BESYLATE 5 MG PO TABS: 5.0000 mg | ORAL_TABLET | Freq: Every day | ORAL | 0 refills | Status: AC

## 2025-01-04 NOTE — Assessment & Plan Note (Signed)
 Clinical picture with severe episode possibly MAS episode causing liver swelling and thrombocytopenia. Main complaint right now is his vasculitic lesions of both feet. He is off the Plaquenil  as there was concern of drug associated injury, but this clinical pattern with localized gastrointestinal organ inflammation, coagulopathy with small vessel vasculitic lesions and severe cytopenias with acute onset does not seem consistent with this medication or for acute onset more than 3 months after starting daily regimen.  Prednisone effective short-term; CellCept considered for long-term management. Discussed CellCept risks, including liver damage and alcohol interaction. - Continue dexamethasone  taper but splitting to BID scheduling, currently 21 mg/day dexamethasone  - Initiate CellCept (mycophenolate) after prednisone tapering at least 1-2 weeks.  Orders:   amLODipine  (NORVASC ) 5 MG tablet; Take 1 tablet (5 mg total) by mouth daily.   gabapentin  (NEURONTIN ) 100 MG capsule; Take 1 capsule (100 mg total) by mouth 2 (two) times daily.   traMADol  (ULTRAM ) 50 MG tablet; Take 1 tablet (50 mg total) by mouth every 6 (six) hours as needed for up to 7 days.

## 2025-01-04 NOTE — Patient Instructions (Addendum)
 Split steroid pills to be twice daily half and half Start amlodipine  5 mg daily for circulation Start aspirin 81 mg daily for circulation Start gabapentin  100 mg twice daily for pain Tramadol  50 mg as needed up to once every 6 hours for pain  We can follow up next week, I will recheck lab tests at that time Contact the ER if symptoms worsening, or give us  a call during week days if having medication questions   Here is some information about the medication I would plan to start in the future:

## 2025-01-04 NOTE — Progress Notes (Signed)
 "  Office Visit Note  Patient: Zachary Castaneda             Date of Birth: 12-Oct-2001           MRN: [id number]             PCP: Jerrell Cleatus Ned, MD Referring: Jerrell Cleatus Ned, MD Visit Date: 01/04/2025 Occupation: Data Unavailable  Subjective:  New Patient (Initial Visit) (Patient states he was sent to our office for Lupus. )   Discussed the use of AI scribe software for clinical note transcription with the patient, who gave verbal consent to proceed.  History of Present Illness   Zachary Castaneda is a 24 year old male with apparently new onset systemic lupus who presents with joint pain, swelling, weight loss with loss of appetite.  He has been experiencing joint pain and swelling since August or September, initially affecting his shoulder blade and hip. The joint pain improved with Plaquenil , which he has not taken since his recent hospital discharge, where he was placed on steroids. He had been experiencing ongoing low appetite and intermittent abdominal pain. No nausea or vomiting until recent events.  He reports a fungal infection in his throat around October, which resolved without recurrence after initial treatment. No history of liver problems, pancreatitis, alcohol use, or smoking.  He is currently on a tapering dose of prednisone, starting at three and a half tablets daily for seven days. He has not resumed Plaquenil  since his discharge.  He reports new symptoms of skin discoloration and pain in his toes. This started at his hospitalization and symptoms have persisted since discharge. The pain in his feet has been significant enough to disrupt his sleep.  No history of easy bruising or bleeding, except for the recent epistaxis, and he has never had blood clots.   Unfortunately he was hospitalized 10 days ago due to worsening symptoms with severe abdominal pain, leg swelling, nausea and vomiting. Workup and recent hospitalization for concerns of possible  macrophage activation syndrome vs acute infection vs plaquenil  drug toxicity. Findings showed severe transaminitis >2000 and hyperferritinemia >50,000 with pancytopenia especially thrombocytopenia. Mild schistocytosis, LDH elevated, fibrinogen decreased. He was treated with IV fluids, HCQ discontinued, and started on high dose dexamethasone  with good symptom improvement and partial lab normalization.    Activities of Daily Living:  Patient reports morning stiffness for 0 minute.   Patient Denies nocturnal pain.  Difficulty dressing/grooming: Denies Difficulty climbing stairs: Reports Difficulty getting out of chair: Denies Difficulty using hands for taps, buttons, cutlery, and/or writing: Denies  Review of Systems  Constitutional:  Negative for fatigue.  HENT:  Negative for mouth sores and mouth dryness.   Eyes:  Negative for dryness.  Respiratory:  Negative for shortness of breath.   Cardiovascular:  Negative for chest pain and palpitations.  Gastrointestinal:  Negative for blood in stool, constipation and diarrhea.  Endocrine: Positive for increased urination.  Genitourinary:  Negative for involuntary urination.  Musculoskeletal:  Positive for joint pain, joint pain and joint swelling. Negative for gait problem, myalgias, muscle weakness, morning stiffness, muscle tenderness and myalgias.  Skin:  Positive for hair loss. Negative for color change, rash and sensitivity to sunlight.  Allergic/Immunologic: Negative for susceptible to infections.  Neurological:  Negative for dizziness and headaches.  Hematological:  Negative for swollen glands.  Psychiatric/Behavioral:  Positive for sleep disturbance. Negative for depressed mood. The patient is not nervous/anxious.     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   Seborrheic dermatitis 10/12/2024   SLE (systemic lupus erythematosus)  (HCC) 09/28/2024   Rash of both hands 09/14/2024   Alpha thalassemia minor trait 09/14/2024   Weight loss 09/06/2024   ADHD (attention deficit hyperactivity disorder), 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   Migraine without aura and without status migrainosus, not intractable 01/12/2016    Past Medical History:  Diagnosis Date   ADD (attention deficit disorder)    Allergy    Anemia    Esophageal candidiasis (HCC) 09/14/2024   Migraine    Scoliosis     Family History  Problem Relation Age of Onset   Migraines Brother    Migraines Mother    Diabetes Maternal Grandmother    Heart disease Maternal Grandmother    Hypertension Maternal Grandmother    Stroke Maternal Grandfather    Kidney failure Paternal Grandmother    Kidney disease Paternal Grandmother    Cancer Paternal Grandfather    Past Surgical History:  Procedure Laterality Date   CIRCUMCISION     EYE SURGERY     for sty removal   HERNIA REPAIR     UMBILICAL HERNIA REPAIR Right 05/28/2020   Procedure: LAPARASCOPIC RIGHT INGUINAL HERNIA REPAIR;  Surgeon: Chuckie Casimiro KIDD, MD;  Location: [facility] OR;  Service: Pediatrics;  Laterality: Right;   Social History[1] Social History   Social History Narrative   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 He lives with his mother and twin brothers. He enjoys basketball, running, and video games.      There is no immunization history on file for this patient.   Objective: Vital Signs: BP 120/81 (BP Location: Right Arm, Patient Position: Sitting, Cuff Size: Normal)   Pulse (!) 103   Temp 99.1 F (37.3 C)   Resp 14   Ht 6' 7 (2.007 m)   Wt 165 lb (74.8 kg)   BMI 18.59 kg/m    Physical Exam HENT:     Mouth/Throat:     Mouth: Mucous membranes are moist.     Comments: Erythematous flat lesions throughout palate of mouth Eyes:     Conjunctiva/sclera: Conjunctivae normal.  Cardiovascular:     Rate and Rhythm: Normal rate and regular  rhythm.  Pulmonary:     Effort: Pulmonary effort is normal.     Breath sounds: Normal breath sounds.  Lymphadenopathy:     Cervical: No cervical adenopathy.  Skin:    General: Skin is warm and dry.     Findings: Rash present.     Comments: Erythematous flat lesions throughout palate of mouth  Neurological:     Mental Status: He is alert.  Psychiatric:        Mood and Affect: Mood normal.      Musculoskeletal Exam:  Shoulders full ROM no tenderness or swelling Elbows full ROM no tenderness or swelling Wrists full ROM no tenderness or swelling Fingers full ROM no tenderness or swelling Knees full ROM no tenderness or swelling Bilateral ankle swelling and tenderness to pressure and movement, right side warmth to touch left side is cool to touch        Investigation: No additional findings.  Imaging: No results found.  Recent Labs: Lab Results  Component Value Date   WBC 4.9 11/12/2024   HGB 10.2 (L) 11/12/2024   PLT 441.0 (H) 11/12/2024   NA 135 11/12/2024  K 4.1 11/12/2024   CL 100 11/12/2024   CO2 28 11/12/2024   GLUCOSE 94 11/12/2024   BUN 4 (L) 11/12/2024   CREATININE 0.57 11/12/2024   BILITOT 0.7 11/12/2024   ALKPHOS 58 11/12/2024   AST 25 11/12/2024   ALT 12 11/12/2024   PROT 7.4 11/12/2024   ALBUMIN 3.4 (L) 11/12/2024   CALCIUM 8.9 11/12/2024   GFRAA NOT CALCULATED 07/09/2011    Speciality Comments: No specialty comments available.  Procedures:  No procedures performed Allergies: Blueberry [berry]   Assessment / Plan:     Visit Diagnoses:  Assessment & Plan Other forms of systemic lupus erythematosus, unspecified organ involvement status (HCC) Discoid lupus erythematosus of scalp Rash of both hands Clinical picture with severe episode possibly MAS episode causing liver swelling and thrombocytopenia. Main complaint right now is his vasculitic lesions of both feet. He is off the Plaquenil  as there was concern of drug associated injury, but  this clinical pattern with localized gastrointestinal organ inflammation, coagulopathy with small vessel vasculitic lesions and severe cytopenias with acute onset does not seem consistent with this medication or for acute onset more than 3 months after starting daily regimen.  Prednisone effective short-term; CellCept considered for long-term management. Discussed CellCept risks, including liver damage and alcohol interaction. - Continue dexamethasone  taper but splitting to BID scheduling, currently 21 mg/day dexamethasone  - Initiate CellCept (mycophenolate) after prednisone tapering at least 1-2 weeks.  Orders:   amLODipine  (NORVASC ) 5 MG tablet; Take 1 tablet (5 mg total) by mouth daily.   gabapentin  (NEURONTIN ) 100 MG capsule; Take 1 capsule (100 mg total) by mouth 2 (two) times daily.   traMADol  (ULTRAM ) 50 MG tablet; Take 1 tablet (50 mg total) by mouth every 6 (six) hours as needed for up to 7 days.  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  Left foot significantly colder than right on exam was concerning but had a vascular ultrasound in the hospital on January 12 that was negative for any arterial thrombosis down to the level of anterior tibial artery.  Based on this I do not think he requires any procedural intervention and no strict indication for plasma exchange therapy unless symptoms do not continue to improve.  We discussed options of returning to the emergency department for hospitalization patient and family have a preference for at home management if possible and he has been  progressively improving except for uncontrolled foot pain. - Start aspirin 81 mg p.o. daily - Start amlodipine  5 mg p.o. daily - Start gabapentin  100 mg twice daily - Prescribed tramadol  50 mg p.o. every 6 hours as needed for pain Orders:   amLODipine  (NORVASC ) 5 MG tablet; Take 1 tablet (5 mg total) by mouth daily.   gabapentin  (NEURONTIN ) 100 MG capsule; Take 1 capsule (100 mg total) by mouth 2 (two) times daily.   traMADol  (ULTRAM ) 50 MG tablet; Take 1 tablet (50 mg total) by mouth every 6 (six) hours as needed for up to 7 days.  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 Platelet count improving but remains a bleeding risk. Steroid use may increase bleeding risk.  Cautiously recommending addition of low-dose  aspirin for coagulopathy with active vasculitic lesions in toes but with close monitoring for any increased bleeding such as epi taxis or bruising    Steroid-induced side effects Current steroid regimen causing facial swelling, fluid retention, and increased bruising risk. Necessary for short-term MAS control but requires careful management. - Continue to taper prednisone dose to minimize side effects. - Educated on potential side effects of steroids and signs to monitor.    Follow-Up Instructions: Return in 6 days (on 01/10/2025) for [facility] to [facility] for f/u.   Lonni LELON Ester, MD  Note - This record has been created using [employer].  Chart creation errors have been sought, but may not always  have been located. Such creation errors do not reflect on  the standard of medical care.      [1]  Social History Tobacco Use   Smoking status: Never    Passive exposure: Never   Smokeless tobacco: Never  Vaping Use   Vaping status: Never Used  Substance Use Topics   Alcohol use: No   Drug use: No   "

## 2025-01-04 NOTE — Assessment & Plan Note (Addendum)
 Clinical picture with severe episode possibly MAS episode causing liver swelling and thrombocytopenia. Main complaint right now is his vasculitic lesions of both feet. He is off the Plaquenil  as there was concern of drug associated injury, but this clinical pattern with localized gastrointestinal organ inflammation, coagulopathy with small vessel vasculitic lesions and severe cytopenias with acute onset does not seem consistent with this medication or for acute onset more than 3 months after starting daily regimen.  Prednisone effective short-term; CellCept considered for long-term management. Discussed CellCept risks, including liver damage and alcohol interaction. - Continue dexamethasone  taper but splitting to BID scheduling, currently 21 mg/day dexamethasone  - Initiate CellCept (mycophenolate) after prednisone tapering at least 1-2 weeks.  Orders:   amLODipine  (NORVASC ) 5 MG tablet; Take 1 tablet (5 mg total) by mouth daily.   gabapentin  (NEURONTIN ) 100 MG capsule; Take 1 capsule (100 mg total) by mouth 2 (two) times daily.   traMADol  (ULTRAM ) 50 MG tablet; Take 1 tablet (50 mg total) by mouth every 6 (six) hours as needed for up to 7 days.

## 2025-01-04 NOTE — Assessment & Plan Note (Signed)
 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 Platelet count improving but remains a bleeding risk. Steroid use may increase bleeding risk.  Cautiously recommending addition of low-dose aspirin for coagulopathy with active vasculitic lesions in toes but with close monitoring for any increased bleeding such as epi taxis or bruising

## 2025-01-04 NOTE — Telephone Encounter (Signed)
 Please advise.

## 2025-01-04 NOTE — Assessment & Plan Note (Addendum)
 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  Left foot significantly colder than right on exam was concerning but had a vascular ultrasound in the hospital on January 12 that was negative for any arterial thrombosis down to the level of anterior tibial artery.  Based on this I do not think he requires any procedural intervention and no strict indication for plasma exchange therapy unless symptoms do not continue to improve.  We discussed options of returning to the emergency department for hospitalization patient and family have a preference for at home management if possible and he has been progressively improving except for uncontrolled foot pain. - Start aspirin 81 mg p.o. daily - Start amlodipine  5 mg p.o. daily - Start gabapentin  100 mg twice daily - Prescribed tramadol  50 mg p.o. every 6 hours as needed for pain Orders:   amLODipine  (NORVASC ) 5 MG tablet; Take 1 tablet (5 mg total) by mouth daily.   gabapentin  (NEURONTIN ) 100 MG capsule; Take 1 capsule (100 mg total) by mouth 2 (two) times daily.   traMADol  (ULTRAM ) 50 MG tablet; Take 1 tablet (50 mg total) by mouth every 6 (six) hours as needed for up to 7 days.

## 2025-01-07 ENCOUNTER — Encounter: Payer: Self-pay | Admitting: Student in an Organized Health Care Education/Training Program

## 2025-01-07 ENCOUNTER — Encounter (HOSPITAL_COMMUNITY): Payer: Self-pay

## 2025-01-07 ENCOUNTER — Telehealth (INDEPENDENT_AMBULATORY_CARE_PROVIDER_SITE_OTHER): Admitting: Student in an Organized Health Care Education/Training Program

## 2025-01-07 ENCOUNTER — Emergency Department (HOSPITAL_COMMUNITY)
Admission: EM | Admit: 2025-01-07 | Discharge: 2025-01-08 | Disposition: A | Discharge status: Other Acute Inpatient Hospital | Attending: Emergency Medicine | Admitting: Emergency Medicine

## 2025-01-07 ENCOUNTER — Telehealth: Payer: Self-pay

## 2025-01-07 ENCOUNTER — Other Ambulatory Visit: Payer: Self-pay

## 2025-01-07 ENCOUNTER — Emergency Department (HOSPITAL_COMMUNITY)

## 2025-01-07 DIAGNOSIS — I776 Arteritis, unspecified: Secondary | ICD-10-CM | POA: Insufficient documentation

## 2025-01-07 DIAGNOSIS — L97529 Non-pressure chronic ulcer of other part of left foot with unspecified severity: Secondary | ICD-10-CM | POA: Diagnosis not present

## 2025-01-07 DIAGNOSIS — M328 Other forms of systemic lupus erythematosus: Secondary | ICD-10-CM

## 2025-01-07 DIAGNOSIS — Z7982 Long term (current) use of aspirin: Secondary | ICD-10-CM | POA: Diagnosis not present

## 2025-01-07 DIAGNOSIS — L97519 Non-pressure chronic ulcer of other part of right foot with unspecified severity: Secondary | ICD-10-CM | POA: Diagnosis not present

## 2025-01-07 DIAGNOSIS — L819 Disorder of pigmentation, unspecified: Secondary | ICD-10-CM | POA: Insufficient documentation

## 2025-01-07 DIAGNOSIS — R21 Rash and other nonspecific skin eruption: Secondary | ICD-10-CM | POA: Diagnosis present

## 2025-01-07 LAB — COMPREHENSIVE METABOLIC PANEL WITH GFR
ALT: 224 U/L — ABNORMAL HIGH (ref 0–44)
AST: 53 U/L — ABNORMAL HIGH (ref 15–41)
Albumin: 3.5 g/dL (ref 3.5–5.0)
Alkaline Phosphatase: 118 U/L (ref 38–126)
Anion gap: 11 (ref 5–15)
BUN: 22 mg/dL — ABNORMAL HIGH (ref 6–20)
CO2: 24 mmol/L (ref 22–32)
Calcium: 8.9 mg/dL (ref 8.9–10.3)
Chloride: 105 mmol/L (ref 98–111)
Creatinine, Ser: 0.76 mg/dL (ref 0.61–1.24)
GFR, Estimated: >60 mL/min
Glucose, Bld: 101 mg/dL — ABNORMAL HIGH (ref 70–99)
Potassium: 4.2 mmol/L (ref 3.5–5.1)
Sodium: 140 mmol/L (ref 135–145)
Total Bilirubin: 1.2 mg/dL (ref 0.0–1.2)
Total Protein: 7.1 g/dL (ref 6.5–8.1)

## 2025-01-07 LAB — CBC WITH DIFFERENTIAL/PLATELET
Abs Immature Granulocytes: 0.35 K/uL — ABNORMAL HIGH (ref 0.00–0.07)
Basophils Absolute: 0.1 K/uL (ref 0.0–0.1)
Basophils Relative: 0 %
Eosinophils Absolute: 0 K/uL (ref 0.0–0.5)
Eosinophils Relative: 0 %
HCT: 31.5 % — ABNORMAL LOW (ref 39.0–52.0)
Hemoglobin: 9.7 g/dL — ABNORMAL LOW (ref 13.0–17.0)
Immature Granulocytes: 2 %
Lymphocytes Relative: 13 %
Lymphs Abs: 3 K/uL (ref 0.7–4.0)
MCH: 21.4 pg — ABNORMAL LOW (ref 26.0–34.0)
MCHC: 30.8 g/dL (ref 30.0–36.0)
MCV: 69.5 fL — ABNORMAL LOW (ref 80.0–100.0)
Monocytes Absolute: 2.7 K/uL — ABNORMAL HIGH (ref 0.1–1.0)
Monocytes Relative: 12 %
Neutro Abs: 17 K/uL — ABNORMAL HIGH (ref 1.7–7.7)
Neutrophils Relative %: 73 %
Platelets: 272 K/uL (ref 150–400)
RBC: 4.53 MIL/uL (ref 4.22–5.81)
RDW: 25.2 % — ABNORMAL HIGH (ref 11.5–15.5)
Smear Review: NORMAL
WBC: 23 K/uL — ABNORMAL HIGH (ref 4.0–10.5)
nRBC: 0.1 % (ref 0.0–0.2)

## 2025-01-07 LAB — SEDIMENTATION RATE: Sed Rate: 22 mm/h — ABNORMAL HIGH (ref 0–16)

## 2025-01-07 LAB — PROTIME-INR
INR: 0.9 (ref 0.8–1.2)
Prothrombin Time: 13.1 s (ref 11.4–15.2)

## 2025-01-07 LAB — C-REACTIVE PROTEIN: CRP: 1.6 mg/dL — ABNORMAL HIGH

## 2025-01-07 LAB — MAGNESIUM: Magnesium: 1.7 mg/dL (ref 1.7–2.4)

## 2025-01-07 LAB — RETICULOCYTES
Immature Retic Fract: 22.1 % — ABNORMAL HIGH (ref 2.3–15.9)
RBC.: 4.53 MIL/uL (ref 4.22–5.81)
Retic Count, Absolute: 107.8 K/uL (ref 19.0–186.0)
Retic Ct Pct: 2.4 % (ref 0.4–3.1)

## 2025-01-07 LAB — PHOSPHORUS: Phosphorus: 2.7 mg/dL (ref 2.5–4.6)

## 2025-01-07 MED ORDER — MORPHINE SULFATE (PF) 2 MG/ML IV SOLN
2.0000 mg | INTRAVENOUS | Status: DC | PRN
  Administered 2025-01-08: 2 mg via INTRAVENOUS
  Filled 2025-01-07 (×2): qty 1

## 2025-01-07 MED ORDER — IOHEXOL 350 MG/ML SOLN
100.0000 mL | Freq: Once | INTRAVENOUS | Status: AC | PRN
  Administered 2025-01-07: 100 mL via INTRAVENOUS

## 2025-01-07 MED ORDER — LACTATED RINGERS IV BOLUS
1000.0000 mL | Freq: Once | INTRAVENOUS | Status: AC
  Administered 2025-01-07: 1000 mL via INTRAVENOUS

## 2025-01-07 MED ORDER — METHYLPREDNISOLONE SODIUM SUCC 125 MG IJ SOLR
125.0000 mg | Freq: Once | INTRAMUSCULAR | Status: AC
  Administered 2025-01-07: 125 mg via INTRAVENOUS
  Filled 2025-01-07: qty 2

## 2025-01-07 MED ORDER — LACTATED RINGERS IV SOLN
INTRAVENOUS | Status: DC
  Administered 2025-01-08: 100 mL/h via INTRAVENOUS

## 2025-01-07 NOTE — ED Provider Notes (Signed)
 " [place] [facility] Provider Note   CSN: [id number] Arrival date & time: 01/07/25  1545     History Chief Complaint  Patient presents with   Foot Pain   discolored toes     HPI: Zachary Castaneda is a 24 y.o. male with history pertinent for lupus, alpha thalassemia minor trait who presents complaining of rash of bilateral feet/toes. Patient arrived via POV accompanied by stepmother and father.  History provided by patient and parent.  No interpreter required during this encounter.  Patient reports that he was recently diagnosed with lupus, was recently started on Plaquenil , and was recently hospitalized, and after that hospitalization discontinued the Plaquenil  and began a course of dexamethasone  with which she has been adherent.  With regard to the reasoning for his hospitalization, he had multiple symptoms including altered mental status, weight loss, weakness.  Parents report that they were never given a formal diagnosis of what was occurring.  Stepmother states that she first noted the discoloration to the patient's toes during that hospitalization (after stopping the Plaquenil , however prior to starting the dexamethasone ).  Patient was discharged on 01/01/2025.  Has continued the Methasone, however has had worsening of the discoloration of his toes, as well as increased pain and swelling of his feet.  They have been treating the pain at home with Epsom salt soaks, elevation which partially improves the pain and the swelling, however the lesions have continued to progress.  Reports that occasionally he will have difficulty walking due to the pain.  Reports that he does follow with a rheumatologist, Dr. Jeannetta, who advised him to come to the emergency department for a vascular scan due to concern for lack of blood flow.  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  Discharge summary does note that patient had reduced pulses in his bilateral feet as well as toe discoloration: Reduced pulses in lower extremity- Noted overnight 1/11-12, along with toe discoloration. Arterial duplex study showed patent vasculature without clots or stenosis. 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  Left foot significantly colder than right on exam was concerning but had a vascular ultrasound in the hospital on January 12 that was negative for any arterial thrombosis down to the level of anterior tibial artery.  Based on this I do not think he requires any procedural intervention and no strict indication for plasma exchange therapy unless symptoms do not continue to improve.  We discussed options of returning to the emergency department for hospitalization patient and family have a preference for at home management if possible and he has been progressively improving except for uncontrolled foot pain. - Start aspirin 81 mg p.o. daily - Start amlodipine  5 mg p.o. daily - Start gabapentin  100 mg twice daily - Prescribed tramadol  50 mg p.o. 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  Arterial Dopplers in the hospital ruled out  large vessel thrombotic disease.  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  Comorbidities recently of thrombocytopenia and liver enzyme elevation without clear etiology and also raises possibility of microangiopathic hemolytic anemia.  The condition seems to be worsening despite the use of high-dose steroids and antiplatelet agents.  I think this is a high risk situation where he may have tissue loss or even total loss from this ischemia.  I spoke with Dr. Jeannetta over the phone about this, he is also going to contact the patient.  We are leaning towards recommending that the patient go back to the hospital for plasma exchange to try to treat the small vessel vasculitis and improve blood flow to the tissue of the toes.     Rheumatology PACU note from earlier today: I spoke to the patient regarding his treatment plan. The previous plan as discussed with him this morning after his visit with his PCP was to have the patient go to [facility] in [place] to be able to have him at a hospital with inpatient rheumatology consult available. However, after speaking to Dr. Vernell Geralds, there are no beds available and he would have to wait in the emergency room for an unknown amount of time. At this time, patient was advised to go to the [facility] emergency room for evaluation and admission. This way, he can at least be seen by vascular surgery inpatient and we can directly consult with his hospital team in regards to our recommended treatment. Patient was agreeable to this plan.    Patient's recorded medical, surgical, social, medication list and allergies were reviewed in the Snapshot window as part of the initial history.   Prior to Admission medications  Medication Sig Start Date End Date Taking? Authorizing Provider  amLODipine  (NORVASC ) 5 MG tablet Take 1 tablet (5 mg total) by mouth daily. 01/04/25  Yes Rice, Lonni ORN, MD  aspirin EC 81 MG tablet Take 81 mg by mouth daily. Swallow whole.   Yes [provider]  dexamethasone  (DECADRON ) 6 MG tablet Take 6 mg by mouth 2 (two) times daily. 01/02/25 02/13/25 Yes [provider]  gabapentin  (NEURONTIN ) 100 MG capsule Take 1 capsule (100 mg total) by mouth 2 (two) times daily. Patient taking differently: Take 100 mg by mouth 2 (two) times daily as needed (pain). 01/04/25  Yes Rice, Lonni ORN, MD  ketoconazole  (NIZORAL ) 2 % shampoo Apply 1 Application topically 2 (two) times a week. 10/15/24  Yes Jerrell Cleatus Ned, MD     Allergies: Blueberry [berry]   Review of Systems   ROS as per HPI  Physical Exam Updated Vital Signs BP (!) 128/94 (BP Location: Right Arm)   Pulse 87   Temp 98.1 F (36.7 C) (Oral)   Resp 16   Ht 6' 6 (1.981 m)   SpO2 98%   BMI 19.07 kg/m  Physical Exam Vitals and nursing note reviewed.  Constitutional:      General: He is not in acute distress.    Appearance: He is well-developed.  HENT:     Head: Normocephalic and atraumatic.  Eyes:     Conjunctiva/sclera: Conjunctivae normal.  Cardiovascular:     Rate and Rhythm: Normal rate and regular rhythm.     Heart sounds:  No murmur heard. Pulmonary:     Effort: Pulmonary effort is normal. No respiratory distress.     Breath sounds: Normal breath sounds.  Abdominal:     Palpations: Abdomen is soft.     Tenderness: There is no abdominal tenderness.  Musculoskeletal:        General: No swelling.     Cervical back: Neck supple.  Skin:    General: Skin is warm and dry.     Capillary Refill: Capillary refill takes less than 2 seconds.     Comments: Purpuric discoloration of bilateral feet on the plantar aspects and all 10 toes, with associated swelling of the lower leg, ankle, feet with tenderness to palpation  Neurological:     Mental Status: He is alert.     Gait: Gait normal.  Psychiatric:        Mood and Affect: Mood normal.          ED  Course/ Medical Decision Making/ A&P  Procedures Procedures   Medications Ordered in ED Medications  lactated ringers  infusion (100 mL/hr Intravenous New Bag/Given 01/08/25 0000)  morphine  (PF) 2 MG/ML injection 2 mg (has no administration in time range)  iohexol  (OMNIPAQUE ) 350 MG/ML injection 100 mL (100 mLs Intravenous Contrast Given 01/07/25 2012)  methylPREDNISolone  sodium succinate (SOLU-MEDROL ) 125 mg/2 mL injection 125 mg (125 mg Intravenous Given 01/07/25 2229)  lactated ringers  bolus 1,000 mL (1,000 mLs Intravenous New Bag/Given 01/07/25 2356)    Medical Decision Making:   Zachary Castaneda is a 24 y.o. male who presents for toe pain and discoloration as per above.  Physical exam is pertinent for purpuric discoloration of bilateral feet on the plantar aspects and all 10 toes, with associated swelling of the lower leg, ankle, feet with tenderness to palpation.   The differential includes but is not limited to vascular occlusion, vasculitis, complication of lupus, Plaquenil  toxicity, coagulopathy.  Independent historian: Spouse/partner  External data reviewed: Notes: Reviewed recent discharge summary multiple outpatient notes  Initial Plan:  Screening labs including CBC and Metabolic panel to evaluate for infectious or metabolic etiology of disease.  Screening coags to evaluate for coagulopathy ESR and CRP to evaluate for underlying infection/inflammation Screening mag and Phos given recent admission for protein calorie malnutrition Reticulocytes to evaluate synthetic function of bone marrow CTA with runoff to evaluate for vascular perfusion of bilateral lower extremities Objective evaluation as below reviewed   Labs: Ordered, Independent interpretation, and Details: CMP with downtrending LFTs on comparison to prior, no AKI, emergent electrolyte derangement.  Mag and Phos reassuring.  CBC with stable anemia, no thrombocytopenia.  Significant leukocytosis, however this is likely  secondary to dexamethasone  use.  Minimal elevation of CRP and ESR.  Coags reassuring, no reticulocytosis.  Double-stranded DNA, C3, C4 in process at the end of my shift  Radiology: Ordered, Independent interpretation, Details: Personally reviewed CTA with runoff, I do appreciate three-vessel runoff bilaterally, and All images reviewed independently.  Agree with radiology report at this time.   CT ANGIO LOWER EXT BILAT W &/OR WO CONTRAST Result Date: 01/07/2025 EXAM: CTA BILATERAL LOWER EXTREMITY 01/07/2025 08:12:14 PM TECHNIQUE: Without and with contrast-enhanced computed tomography angiography of the lower extremity was performed with multiplanar reconstructions. Maximum intensity projection images were created on a separate workstation and reviewed. Automated exposure control, iterative reconstruction, and/or weight based adjustment of the mA/kV was utilized to reduce the radiation dose to as low as reasonably achievable. 100 mL (iohexol  (OMNIPAQUE ) 350 MG/ML injection 100 mL IOHEXOL  350 MG/ML SOLN)  was administered. COMPARISON: None available. CLINICAL HISTORY: Lower extremity vasculitis, known or suspected. FINDINGS: ARTERIAL: No arterial narrowing/stenosis. Patent 3 vessel runoff to the lower calf bilaterally, with diminished contrast distally due to timing/phase of contrast. LEFT COMMON ILIAC ARTERY: No significant stenosis or vessel occlusion. LEFT EXTERNAL ILIAC ARTERY: No significant stenosis or vessel occlusion. COMMON FEMORAL ARTERY: No significant stenosis or vessel occlusion. SUPERFICIAL FEMORAL ARTERY: No significant stenosis or vessel occlusion. POPLITEAL ARTERY: No significant stenosis or vessel occlusion. TIBIOPERONEAL TRUNK: No significant stenosis or vessel occlusion. ANTERIOR TIBIAL ARTERY: Patent flow to the lower calf bilaterally, with diminished contrast distally due to timing/phase of contrast. PERONEAL ARTERY: Patent flow to the lower calf bilaterally, with diminished contrast distally  due to timing/phase of contrast. POSTERIOR TIBIAL ARTERY: Patent flow to the lower calf bilaterally, with diminished contrast distally due to timing/phase of contrast. No significant stenosis or vessel occlusion. BONES AND SOFT TISSUES: No significant osseous or soft tissue abnormalities seen within the field of view. IMPRESSION: 1. Patent three-vessel runoff to the lower calf bilaterally. 2. No lower extremity vasculitis. No arterial narrowing/stenosis. Electronically signed by: Pinkie Pebbles MD 01/07/2025 09:01 PM EST RP Workstation: [id number]    EKG/Medicine tests: Not indicated EKG Interpretation:                  Interventions: Solu-Medrol , LR bolus  See the EMR for full details regarding lab and imaging results.  Patient presents for progressive pain, swelling, discoloration of bilateral feet and toes, which reportedly has been progressive despite adherence to his dexamethasone  outpatient.  Patient was referred to the emergency department for vascular evaluation, as well as likely need for admission for plasma exchange.  Primary concern is for small vessel vasculitis.  Underwent screening labs as well as CTA with runoff while in triage.  Patient does have mild elevation of AST to 53, however this is down trended from 128 on 1/12 at OSH, and ALT is down trended to 224 from 352 on 1/12 at OSH.  Mag and Phos are reassuring, CBC with stable anemia on comparison to prior, no thrombocytopenia.  Patient does have significant leukocytosis, however this is likely demargination in the setting of recent significant dexamethasone  use.  CRP and ESR are mildly elevated which is nonspecific.  Patient without any apparent reticulocytosis.  Coags reassuring.  Patient only mildly tachycardic, improved spontaneous while in ED, feel that this was likely pain mediated.  CTA was obtained and demonstrates patent three-vessel runoff.  Patient with grossly equal temperature of feet bilaterally, therefore I have a low  index of concern for a large vessel abnormality, increased level of concern for a small vessel abnormality such as small vessel vasculitis.  I did consult rheumatology, spoke with Dr Dolphus, who recommended 125 mg of Solu-Medrol , further labs including C3, C4, double-stranded DNA.  Stated that patient should be admitted at facility with inpatient rheumatology and capacity to perform plasma exchange such as [facility] or [facility].  Recommended consultation with hematology/oncology as well as vascular surgery.  Consulted vascular surgery, spoke with Dr. Myrna who did not have any specific recommendations, stated that given patient has patent three-vessel runoff it was less likely to be a large vessel etiology for which his services would be beneficial.  Also spoke with Dr. Tina with hematology/oncology who agreed with plan for transfer for admission for plasma exchange, did not have other further recommendations regarding medications or laboratory workup.  I spoke with the physician access line at [facility]  Per their regional operational center, unfortunately they do not have any inpatient beds available for either their floor or MICU, therefore they cannot accept the patient at this time, however they will put the patient on the wait list, and if a bed becomes available they will call us  back for and at that time the patient can be discussed between our physicians and a physician within their health system.  Given no beds available through [facility], I also contacted [facility], and spoke with their regional operational center, who stated that they will reach out to their inpatient provider and call back, they did not to give specific details at the time of our discussion regarding whether or not any beds may be available.  If no beds are available at [facility] or [facility], consider admission here for pain control and steroids until bed becomes available at [facility], or equivalent center.   Presentation is most consistent with acute life/limb-threatening illness and Current presentation is complicated by underlying chronic conditions  Discussion of management or test interpretations with external provider(s): Dr Dolphus, rheumatology, Dr. Myrna, vascular surgery, Dr. Tina, hematology/oncology, regional operational center at [facility] and [facility]  Risk Drugs:Prescription drug management Treatment: Decision regarding hospitalization  Disposition: Pending transfer versus admission at the time of handoff, care handed off to oncoming provider.  MDM generated using voice dictation software and may contain dictation errors.  Please contact me for any clarification or with any questions.  Clinical Impression:  1. Discoloration of skin of toe   2. Small vessel vasculitis      Admit   Final Clinical Impression(s) / ED Diagnoses Final diagnoses:  Discoloration of skin of toe  Small vessel vasculitis    Rx / DC Orders ED Discharge Orders     None        Rogelia Jerilynn RAMAN, MD 01/08/25 0015  "

## 2025-01-07 NOTE — ED Provider Notes (Incomplete)
 " [facility] Provider Note   CSN: [id number] Arrival date & time: 01/07/25  1545     History Chief Complaint  Patient presents with   Foot Pain   discolored toes     HPI: Quandarius Nill is a 24 y.o. male with history pertinent for lupus, alpha thalassemia minor trait who presents complaining of rash of bilateral feet/toes. Patient arrived via POV accompanied by stepmother and father.  History provided by patient and parent.  No interpreter required during this encounter.  Patient reports that he was recently diagnosed with lupus, was recently started on Plaquenil , and was recently hospitalized, and after that hospitalization discontinued the Plaquenil  and began a course of dexamethasone  with which she has been adherent.  With regard to the reasoning for his hospitalization, he had multiple symptoms including altered mental status, weight loss, weakness.  Parents report that they were never given a formal diagnosis of what was occurring.  Stepmother states that she first noted the discoloration to the patient's toes during that hospitalization (after stopping the Plaquenil , however prior to starting the dexamethasone ).  Patient was discharged on 01/01/2025.  Has continued the Methasone, however has had worsening of the discoloration of his toes, as well as increased pain and swelling of his feet.  They have been treating the pain at home with Epsom salt soaks, elevation which partially improves the pain and the swelling, however the lesions have continued to progress.  Reports that occasionally he will have difficulty walking due to the pain.  Reports that he does follow with a rheumatologist, Dr. Jeannetta, who advised him to come to the emergency department for a vascular scan due to concern for lack of blood flow.  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  Discharge summary does note that patient had reduced pulses in his bilateral feet as well as toe discoloration: Reduced pulses in lower extremity- Noted overnight 1/11-12, along with toe discoloration. Arterial duplex study showed patent vasculature without clots or stenosis. 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  Left foot significantly colder than right on exam was concerning but had a vascular ultrasound in the hospital on January 12 that was negative for any arterial thrombosis down to the level of anterior tibial artery.  Based on this I do not think he requires any procedural intervention and no strict indication for plasma exchange therapy unless symptoms do not continue to improve.  We discussed options of returning to the emergency department for hospitalization patient and family have a preference for at home management if possible and he has been progressively improving except for uncontrolled foot pain. - Start aspirin 81 mg p.o. daily - Start amlodipine  5 mg p.o. daily - Start gabapentin  100 mg twice daily - Prescribed tramadol  50 mg p.o. 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  Arterial Dopplers in the hospital ruled out  large vessel thrombotic disease.  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  Comorbidities recently of thrombocytopenia and liver enzyme elevation without clear etiology and also raises possibility of microangiopathic hemolytic anemia.  The condition seems to be worsening despite the use of high-dose steroids and antiplatelet agents.  I think this is a high risk situation where he may have tissue loss or even total loss from this ischemia.  I spoke with Dr. Jeannetta over the phone about this, he is also going to contact the patient.  We are leaning towards recommending that the patient go back to the hospital for plasma exchange to try to treat the small vessel vasculitis and improve blood flow to the tissue of the toes.     Rheumatology PACU note from earlier today: I spoke to the patient regarding his treatment plan. The previous plan as discussed with him this morning after his visit with his PCP was to have the patient go to [facility] in [place] to be able to have him at a hospital with inpatient rheumatology consult available. However, after speaking to Dr. Vernell Geralds, there are no beds available and he would have to wait in the emergency room for an unknown amount of time. At this time, patient was advised to go to the [facility] emergency room for evaluation and admission. This way, he can at least be seen by vascular surgery inpatient and we can directly consult with his hospital team in regards to our recommended treatment. Patient was agreeable to this plan.    Patient's recorded medical, surgical, social, medication list and allergies were reviewed in the Snapshot window as part of the initial history.   Prior to Admission medications  Medication Sig Start Date End Date Taking? Authorizing Provider  amLODipine  (NORVASC ) 5 MG tablet Take 1 tablet (5 mg total) by mouth daily. 01/04/25   Jeannetta Lonni ORN,  MD  dexamethasone  (DECADRON ) 6 MG tablet Take by mouth. 01/02/25 02/13/25  [provider]  gabapentin  (NEURONTIN ) 100 MG capsule Take 1 capsule (100 mg total) by mouth 2 (two) times daily. 01/04/25   Rice, Lonni ORN, MD  ketoconazole  (NIZORAL ) 2 % shampoo Apply 1 Application topically 2 (two) times a week. 10/15/24   Jerrell Cleatus Ned, MD  traMADol  (ULTRAM ) 50 MG tablet Take 1 tablet (50 mg total) by mouth every 6 (six) hours as needed for up to 7 days. 01/04/25 01/11/25  Jeannetta Lonni ORN, MD     Allergies: Blueberry [berry]   Review of Systems   ROS as per HPI  Physical Exam Updated Vital Signs BP 102/75 (BP Location: Left Arm)   Pulse (!) 111   Temp 98.4 F (36.9 C) (Oral)   Resp 16   Ht 6' 6 (1.981 m)   SpO2 100%   BMI 19.07 kg/m  Physical Exam       ED Course/ Medical Decision Making/ A&P Clinical Course as of 01/07/25 2358  Mon Jan 07, 2025  2214 Dewaswar  [LS]    Clinical Course User Index [LS] Rogelia Jerilynn RAMAN, MD    Procedures Procedures   Medications Ordered in ED Medications - No data to display  Medical Decision Making:   Kolbie Lepkowski is a 24 y.o. male who presents for ***as  per above.  Physical exam is pertinent for ***.   The differential includes but is not limited to ***.  Independent historian: {LSHISTORIAN:33403}  External data reviewed: {LSEXTERNALDATA:33407}  Initial Plan:  ***Screening labs including CBC and Metabolic panel to evaluate for infectious or metabolic etiology of disease.  *** ***Urinalysis with reflex culture ordered to evaluate for UTI or relevant urologic/nephrologic pathology.  *** to evaluate for structural/infectious intra-*** pathology.  {crccardiactesting:32591::EKG to evaluate for cardiac pathology} Objective evaluation as below reviewed   Labs: {LSLABS:33416}  Radiology: {LSRADS:33417} No results found.  EKG/Medicine tests: {LSEKG:33414} EKG Interpretation:                   Interventions:***   See the EMR for full details regarding lab and imaging results.  Patient presents for progressive pain, swelling, discoloration of bilateral feet and toes, which reportedly has been progressive despite adherence to his dexamethasone  outpatient.  Patient was referred to the emergency department for vascular evaluation, as well as likely need for admission for plasma exchange.  Primary concern is for small vessel vasculitis.  Underwent screening labs as well as CTA with runoff while in triage.  Patient does have mild elevation of AST to 53, however this is down trended from 128 on 1/12 at OSH, and ALT is down trended to 224 from 352 on 1/12 at OSH.  Mag and Phos are reassuring, CBC with stable anemia on comparison to prior, no thrombocytopenia.  Patient does have significant leukocytosis, however this is likely demargination in the setting of recent significant dexamethasone  use.  CRP and ESR are mildly elevated which is nonspecific.  Patient without any apparent reticulocytosis.  Coags reassuring.  CTA was obtained and demonstrates patent three-vessel runoff.  Patient with grossly equal temperature of feet bilaterally, therefore I have a low index of concern for a large vessel  ALT 0 - 55 U/L 352 High   AST 0 - 40 U/L 128 High     {LSCOPA:33420}  Discussion of management or test interpretations with external provider(s): ***  Risk Drugs:{LSDRUGS:33399} Treatment: {LSTREATMENT:33409} Surgery:{LSSURGERY:33410} Critical Care: ***  Disposition: {LSDISPO:33388}  MDM generated using voice dictation software and may contain dictation errors.  Please contact me for any clarification or with any questions.  Clinical Impression: No diagnosis found.   Data Unavailable   Final Clinical Impression(s) / ED Diagnoses Final diagnoses:  None    Rx / DC Orders ED Discharge Orders     None      "

## 2025-01-07 NOTE — Telephone Encounter (Signed)
 Appt already scheduled

## 2025-01-07 NOTE — Assessment & Plan Note (Signed)
 Very high risk situation for bilateral feet.  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  Arterial Dopplers in the hospital ruled out large vessel thrombotic disease.  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  Comorbidities recently of thrombocytopenia and liver enzyme elevation without clear etiology and also raises possibility of microangiopathic hemolytic anemia.  The condition seems to be worsening despite the use of high-dose steroids and antiplatelet agents.  I think this is a high risk situation where he may have tissue loss or even total loss from this ischemia.  I spoke with Dr. Jeannetta over the phone about this, he is also going to contact the patient.  We are leaning towards recommending that the patient go back to the hospital for plasma exchange to try to treat the small vessel vasculitis and improve blood flow to the tissue of the toes.

## 2025-01-07 NOTE — Patient Instructions (Signed)
" °  VISIT SUMMARY: During your visit, we discussed your recent hospitalization and the ongoing issues with your lupus, including the worsening rash and foot pain. We reviewed your current medications and made adjustments to help manage your symptoms and improve your condition.  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 We will continue your dexamethasone  treatment, tapering the dose over the next few weeks. You will start taking amlodipine  at half a tablet to improve blood flow to your toes. Continue taking aspirin and keep your feet warm and elevated to help with circulation. Monitor the discoloration of your toes and report any worsening to us  immediately. We will also initiate CellCept after the prednisone taper as per Dr. Joyce instructions.  -ACUTE LIVER INJURY (RESOLVED): Your liver injury has resolved, and no further treatment is needed at this time.  -ACUTE HYPOTENSION (RESOLVED): Your low blood pressure has resolved, and no further treatment is needed at this time.  INSTRUCTIONS: Please follow the dexamethasone  tapering schedule: 21 mg daily for 7 days, then 9 mg daily for 14 days, then 6 mg daily for 14 days. Start taking amlodipine  at half a tablet to improve blood flow to your toes. Continue taking aspirin as prescribed. Keep your feet warm and elevated to aid circulation. Monitor the discoloration of your toes and report any worsening to us  immediately. We will initiate CellCept after the prednisone taper as per Dr. Joyce instructions. Follow up with us  if you experience any new or worsening symptoms.    Contains text generated by Abridge.   "

## 2025-01-07 NOTE — Assessment & Plan Note (Signed)
 Terrible situation of a person that we have been treating for new onset lupus since about September.  He was stable until late December when he started to have more fatigue, declining appetite, and then in early January he developed hypotension and was admitted to the hospital at [facility].  Unclear etiology of the hypotension, but improved with high-dose IV steroids and he was discharged a week ago on dexamethasone  21 mg a day with a plan to taper the steroids over the next 4 weeks.  He has established care with Dr. Jeannetta with rheumatology here in town.  He is feeling better on the steroids, he is now off Plaquenil , they are considering starting CellCept in the coming 1-2 weeks.  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

## 2025-01-07 NOTE — Progress Notes (Signed)
 "                    MyChart Video Visit    Virtual Visit via Video Note   This format is felt to be most appropriate for this patient at this time. Physical exam was limited by quality of the video and audio technology used for the visit.    Patient location: home Provider location: [facility] Persons involved in the visit: patient, provider  I discussed the limitations of evaluation and management by telemedicine and the availability of in person appointments. The patient expressed understanding and agreed to proceed.  Patient: Zachary Castaneda   DOB: [birth year]   24 y.o. Male  MRN: [id number] Visit Date: 01/07/2025  Today's healthcare provider: Cleatus Debby Specking, MD   Subjective:    HPI  Discussed the use of AI scribe software for clinical note transcription with the patient, who gave verbal consent to proceed.  History of Present Illness Zachary Castaneda is a 24 year old male with lupus who presents with worsening rash and foot pain. He is accompanied by his mother.  He was admitted to [facility] on January 5th and discharged on January 13th due to low blood pressure, liver injury, and a worsening rash. During his hospital stay, he was treated with IV steroids. The cause of his low blood pressure was not definitively identified, but it was suspected to be related to lupus. No infection was found despite antibiotic treatment.  He experiences ongoing discomfort in his toes, with the rash becoming more noticeable after his hospital stay. Swelling in his feet had been present prior to hospitalization. He is currently on dexamethasone , taking 21 mg daily, split into two doses.  He has significant pain in his toes, described as burning, with discoloration progressing from red to purple. His mother notes that his left foot was previously cold to the touch, but both feet are now room temperature. He is unable to walk without  assistance due to the pain.  He is currently taking aspirin but has not started amlodipine  due to concerns about dizziness. His blood pressure was recorded as 120/80 mmHg during a recent office visit. His mother has been assisting with care, noting that his ankles remain swollen and there is some skin breakdown on his toes.  No joint pain currently, but significant discomfort in his toes. He is eating better since starting dexamethasone .    Objective:     Physical Exam   Gen: Moderately uncomfortable appearing young man Feet: On camera both feet have discoloration of all 10 toes, they look to be purple with some skin sloughing off the great toes bilaterally.  Mother tells me the feet are room temperature to the touch.  I do not see any deep ulcerations, but the discoloration is becoming darker and more purple to black. Hands: vasculitic rash on the hands is improving.    Assessment & Plan:    Problem List Items Addressed This Visit       High   SLE (systemic lupus erythematosus) (HCC) (Chronic)   Terrible situation of a person that we have been treating for new onset lupus since about September.  He was stable until late December when he started to have more fatigue, declining appetite, and then in early January he developed hypotension and was admitted to the hospital at [facility].  Unclear etiology of the hypotension, but improved with high-dose IV steroids and he was discharged a week ago  on dexamethasone  21 mg a day with a plan to taper the steroids over the next 4 weeks.  He has established care with Dr. Jeannetta with rheumatology here in town.  He is feeling better on the steroids, he is now off Plaquenil , they are considering starting CellCept in the coming 1-2 weeks.  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  Arterial Dopplers in the hospital ruled out large vessel thrombotic disease.  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  Comorbidities recently of thrombocytopenia and liver enzyme elevation without clear etiology and also raises possibility of microangiopathic hemolytic anemia.  The condition seems to be worsening despite the use of high-dose steroids and antiplatelet agents.  I think this is a high risk situation where he may have tissue loss or even total loss from this ischemia.  I spoke with Dr. Jeannetta over the phone about this, he is also going to contact the patient.  We are leaning towards recommending that the patient go back to the hospital for plasma exchange to try to treat the small vessel vasculitis and improve blood flow to the tissue of the toes.       I discussed the assessment and treatment plan with the patient. The patient was provided an opportunity to ask questions and all were answered. The patient agreed with the plan and demonstrated an understanding of the instructions.   The patient was advised to call back or seek an in-person evaluation if the symptoms worsen or if the condition fails to improve as anticipated.  I provided 20 minutes of non-face-to-face time during this encounter.  Cleatus Debby Specking, MD [facility]    "

## 2025-01-07 NOTE — Telephone Encounter (Signed)
 I spoke to the patient regarding his treatment plan. The previous plan as discussed with him this morning after his visit with his PCP was to have the patient go to [facility] in [place] to be able to have him at a hospital with inpatient rheumatology consult available. However, after speaking to Dr. Vernell Geralds, there are no beds available and he would have to wait in the emergency room for an unknown amount of time. At this time, patient was advised to go to the [facility] emergency room for evaluation and admission. This way, he can at least be seen by vascular surgery inpatient and we can directly consult with his hospital team in regards to our recommended treatment. Patient was agreeable to this plan.   Vylette Strubel S. Mauricio, PA-C

## 2025-01-07 NOTE — ED Triage Notes (Signed)
 Patient sent from rheumatology to been seen by a vascular doctor and also a doctor that deals with lupus. His step mother is here with him and states that all 10 of his toes are discolored. Toes have been discolored since he was at [facility] last week. The color change has gotten worse since.

## 2025-01-08 ENCOUNTER — Telehealth: Payer: Self-pay | Admitting: Rheumatology

## 2025-01-08 DIAGNOSIS — R609 Edema, unspecified: Secondary | ICD-10-CM | POA: Diagnosis not present

## 2025-01-08 DIAGNOSIS — Z743 Need for continuous supervision: Secondary | ICD-10-CM | POA: Diagnosis not present

## 2025-01-08 MED ORDER — HYDROMORPHONE HCL 1 MG/ML IJ SOLN
1.0000 mg | Freq: Once | INTRAMUSCULAR | Status: AC
  Administered 2025-01-08: 1 mg via INTRAVENOUS
  Filled 2025-01-08: qty 1

## 2025-01-08 MED ORDER — HYDROMORPHONE HCL 1 MG/ML IJ SOLN
0.5000 mg | Freq: Once | INTRAMUSCULAR | Status: DC
  Filled 2025-01-08: qty 1

## 2025-01-08 MED ORDER — HYDROMORPHONE HCL 1 MG/ML IJ SOLN
1.0000 mg | Freq: Once | INTRAMUSCULAR | Status: AC
  Administered 2025-01-08: 1 mg via INTRAVENOUS

## 2025-01-08 NOTE — ED Notes (Signed)
 Transfer in process, finalized with [facility]; waiting on their physician to contact us  to solidify set up

## 2025-01-08 NOTE — Telephone Encounter (Signed)
 I received a phone call from Dr. Rogelia (emergency room) at 10:06 PM.  She wanted to know what was Dr. Joyce plan regarding the admission.  She was aware that patient is awaiting a bed at the [facility].  I told her that I had very brief information regarding the patient.  I mention that Dr. Joyce plan was to get vascular surgery and hematology consult.  Dr. Rogelia mentioned that patient had good pulses in both feet.  She also wanted to know what other labs could be done.  I recommended double-stranded ENA, complements.  She mentioned that patient's CBC and CMP were stable.  Maya Nash, MD

## 2025-01-08 NOTE — Consults (Signed)
 Rheumatology Inpatient Consult Note  Date of Consult: 01/08/2025 Consulting Service: Rheumatology Service Requesting Consult: Internal Medicine Consultative Question: Consulted for  evaluation of new small vessel vasculitis effecting toes     SUBJECTIVE: History of Present Illness: Zachary Castaneda is a 24 y.o. male with a PMHx of alpha-thalassemia and presumed lupus presents  SLE Hx: In 09/08/24 he has an ANA  >1:1280 speckled,  positive Anti-dsDNA 14.6, positive anti-sm ab 51, positive anti-u1 rnp ab 161, positive anti-ro (SS-A) Ab 21, positive P-ANCA, with a normal c3 and c4. UDS, RPR and HIV were negative. Urine analysis since september has been remarkable for protein.   Brief Timeline of symptoms - Aug / Sep 2025 - First episode of left hip pain lasting a couple of days; interfered with sleep. Intermittent left shoulder-blade pain followed. - Sep ? present - Episodic joint pains (hip, shoulder blade); pain generally mild and left-sided. - Weight / appetite - Lost ~40 lb over ~1 month due to poor appetite with progressive vomiting; vomiting with eating prompted initial hospital visit. - Hospitalizations & specialist visits - Multiple ED/ward stays (headaches, epididymitis with antibiotics, abdominal imaging for dilated appendix, early-Jan admission for possible infection & elevated LFTs). First rheumatology visit this week; splenomegaly noted, started on steroid taper. - Current issue - Rapid onset over the last few days of dark discoloration, swelling, and significant pain in both feet/toes (right > left). Required crutch use; bruising at crutch contact sites. Family and providers noted discoloration prior to patient awareness. - Associated symptoms - Easy bruising since starting steroids; frequent nosebleeds; scalp itch/rash diagnosed as seborrheic dermatitis; intermittent lack of energy (improved on steroids); daily headaches earlier in the course, none today; increased urinary frequency  since last hospitalization. Had esophageal candaiasis treated with diflucan for 3 weeks - Denies fevers, chills, night sweats, facial rash, oral/nasal ulcers, eye pain or photophobia, abdominal pain, heart palpitations. - Medication changes by rheumatology outpt - Dexamethasone  BID and taper , start cellcept  Positive: joint pain (hip, shoulder blade, feet/toes); foot/toe swelling & discoloration; easy bruising; weight loss; vomiting (during weight-loss period); scalp pruritus/rash; headaches (resolved); increased urinary frequency. Fevers, chills, night sweats   Negative: Facial rash, oral/nasal ulcers, visual changes, eye pain, photophobia, dry eyes, dry mouth, abdominal pain, chest pain, palpitations, shortness of breath, diarrhea, constipation.  Admitted 12/24/24-01/01/25 for weakness, abdominal pain, nausea, and hematemesis. Autoimmune w/u  With ANA 1:1280 speckled, anti-Sm 51, anti-dsDNA 14.6, anti-Ro 21, RF 21, elevated anti-U1 RNP, +P-ANCA. Normal anti-CCP, C3/C4 compliment. His plaquenil  was held due to being on IV antibiotics. Plan was to have him see oupt rheum for a new patient visit. Anemia was found with a ferritin level that was significantly elevated.  MAS was thought to be on the differential and as such pt was treated with 20mg  IV decadron  and transitioned onto dexamethasone . He also had elevated liver enzymes with ALT of 2544, and AST of 6159 which have since improved. The elevations where thought to be from shock. Abdominal imaging report during this hospitalizations  small volume ascites, and nonspecific gallbladder wall thickening. Pt was ultimately discharged with rheum followup on 1/16. Per patient and chart, rheum transitioned Dexamethsone taper to BID and was planning to start CellCept .   Social Hx  Reports  Father 5'11 Mother 5'9   History of Present Illness   Rheumatologic History: Dx: SLE  Serologies: ANA  >1:1280 speckled,  positive Anti-dsDNA 14.6, positive anti-sm  ab 51, positive anti-u1 rnp ab 161, positive anti-ro (SS-A) Ab  21, positive P-ANCA, with a normal c3 and c4. UDS, RPR and HIV were negative. No results found for: ANA, ANATITER BRIEFLAB(dsdnag:*,dsdnaab:*,dsdnatitr:*,rnpab:*,u1snrnpigg:*,smithab:*,smithigg:*,sjogrenaab:*,enassb:*,ssa:*,ssa60:*,jo1ab:*,scl70*,ANCA:*,pr3:*,mpo:*)@ No results found for: RF, HLAB27, CRYOGLOBULIN, B2GP1IGG, B2GP1IGM, B2GP1IGA, IGA, HCVAB, HBSAG, HIV, ACE  No results found for: C3, C4, CH50  No results found for: CRP, ESR  No results found for: DAT  Manifestations  Treatment: Plaquenil ,  Dexamethasone . Planned to start on cellcept   ROS: Patient endorses any checked symptoms, all others are negative. Constitutional: [x]  fevers [x]  weight loss [x]  excessive fatigue  [x]  Night sweats   Opthalmologic: []  vision changes []  dry eyes  []  history of ocular inflammation  HENT: [x]  alopecia []  dry mouth []  oral ulcers []  nasal ulcers   []  temporal HA []  scalp tenderness []  fatigue in jaw with chewing   []  sinus disease []  nasal congestion    Respiratory: []  dyspnea []  cough []  hemoptysis  Gastrointestinal: [x]  abdominal pain []  vomiting []  diarrhea []  GERD []  dysphagia    []  hematochezia []  melena    Renal / GU: []  dysuria []  hematuria [x]  urinary frequency []  change in UOP  Neurologic:  []  headache []  neuropathy  []  seizures  Integument: []  rash [x]  alopecia []  malar rash [x]  nail changes []  psoriasis    []  photosensitive rash (rash for >24 hours after sun exposure)  Extremities []  Raynaud's phenomenon [x]  edema  Psychiatric: []  depression []  anxiety  Musculoskeletal: See HPI.  [x]  joint pain []  AM stiffness of bilateral knees: 20 min []  muscle weakness   []  inflammatory low back pain  []  enthesitis     No past medical history on file. No past surgical history on file. No family history on file. Social History   Socioeconomic History   Marital status: Single   Social  Drivers of Corporate Investment Banker Strain: Low Risk (09/13/2024)   Received from [facility]   Overall Financial Resource Strain (CARDIA)    How hard is it for you to pay for the very basics like food, housing, medical care, and heating?: Not hard at all  Food Insecurity: No Food Insecurity (12/25/2024)   Received from [facility]   Hunger Vital Sign    Within the past 12 months, you worried that your food would run out before you got the money to buy more.: Never true    Within the past 12 months, the food you bought just didn't last and you didn't have money to get more.: Never true  Transportation Needs: No Transportation Needs (12/26/2024)   Received from [facility] - Transportation    In the past 12 months, has lack of transportation kept you from medical appointments or from getting medications?: No    In the past 12 months, has lack of transportation kept you from meetings, work, or from getting things needed for daily living?: No  Physical Activity: Inactive (09/13/2024)   Received from [facility]   Exercise Vital Sign    On average, how many days per week do you engage in moderate to strenuous exercise (like a brisk walk)?: 0 days  Stress: No Stress Concern Present (12/25/2024)   Received from [facility] - Occupational Stress Questionnaire    Do you feel stress - tense, restless, nervous, or anxious, or unable to sleep at night because your mind is troubled all the time - these days?: Only a little  Social Connections: Socially Isolated (09/13/2024)   Received from [facility]   Social Connection and Isolation Panel    In a typical week, how many times do you talk on the phone with family, friends, or neighbors?: More than three times a week    How often do you get together with friends or relatives?: Patient declined    How often do you attend church or religious services?: Never    Do you belong to any clubs  or organizations such as church groups, unions, fraternal or athletic groups, or school groups?: No    Are you married, widowed, divorced, separated, never married, or living with a partner?: Never married  Housing Stability: Low Risk (12/25/2024)   Received from [facility] Stability Vital Sign    In the last 12 months, was there a time when you were not able to pay the mortgage or rent on time?: No    In the past 12 months, how many times have you moved where you were living?: 0    At any time in the past 12 months, were you homeless or living in a shelter (including now)?: No     Medications:   Medications Prior to Admission  Medication Sig Dispense Refill   amLODIPine  (NORVASC ) 5 MG tablet Take 5 mg by mouth once daily     aspirin 81 MG EC tablet Take 81 mg by mouth once daily     dexAMETHasone  (DECADRON ) 6 MG tablet Take 6 mg by mouth 2 (two) times daily with meals     gabapentin  (NEURONTIN ) 100 MG capsule Take 100 mg by mouth 2 (two) times daily     Current Facility-Administered Medications  Medication Dose Route Frequency Provider Last Rate Last Admin   [Held by provider] amLODIPine  (NORVASC ) tablet 5 mg  5 mg Oral Daily Vinita Signe Pac, MD       [facility] by provider] aspirin EC tablet 81 mg  81 mg Oral Daily Vinita Signe Pac, MD       [Held by provider] dexAMETHasone  (DECADRON ) tablet 6 mg  6 mg Oral BID CC Vinita Signe Pac, MD       gabapentin  (NEURONTIN ) capsule 100 mg  100 mg Oral BID Vinita Signe Pac, MD       lidocaine  (XYLOCAINE ) 1 % injection 0.5 mL  0.5 mL Subcutaneous As Directed Vinita Signe Pac, MD        Allergies  Allergen Reactions   Blueberry Shortness Of Breath and Itching    Hasn't had severe reaction since childhood     OBJECTIVE:  Current Vital Signs 24h Vital Sign Ranges  T 37.1 C (98.8 F) Temp  Avg: 37.1 C (98.8 F)  Min: 37.1 C (98.8 F)  Max: 37.1 C (98.8 F)  BP (!) 132/102 BP  Min: 132/102  Max:  132/102  HR 97 Pulse  Avg: 97  Min: 97  Max: 97  RR 18 Resp  Avg: 18  Min: 18  Max: 18  SaO2 98 %   SpO2  Avg: 98 %  Min: 98 %  Max: 98 %   24 Hour I/O Current Shift I/O  Time Ins Outs No intake/output data recorded. No intake/output data recorded.   Weights   First     Last        Physical Exam:   GEN: Well appearing in NAD  HEENT: Alopecia, discoloration in face, no malar rash  CV: Tachycardic  PE: Breathing comfortably on RA MSK: No synovitis in small joints or upper extremities. R>L swelling  in Lower extremities.  Ext: Nail fold abnormality of the Left 5th digit. .Toes with dark discoloration at the plantar aspect  bilaterally  LABS: No results for input(s): NA, K, CO2, BUN, CREATININE, GLUCOSE, CALCIUM, MG in the last 168 hours. No results for input(s): ALT, AST, ALKPHOS, TBILI, ALB in the last 168 hours.  No results for input(s): WBC, HGB, HCT, PLT in the last 168 hours. No results for input(s): APTT, INR in the last 168 hours.  No results for input(s): C3, C4, DSDNA, TPCRERATIO, RBCUA, WBCUA, ESR, CRP in the last 2160 hours.    No results found for: RF, CCP, HLASINGLE, ANA, ANA1, ANAPATRN1, ANA2, ANAPATRN2, SMAB, RNPAB, ROAB, LAAB, DSDNA, C3, C4, CRDLPNIGG, CRDLPNIGM, CRDLPNIGA, B2GP1IGG, B2GP1IGM, B2GP1IGA, DRVVT, ANCASCN, ANCA, PATTERN, MPO, SCL70AB, RNAP3  Diagnostics: reviewed and summarized as below.   ASSESSMENT:   24 y/o male ( ANA+, dsDNA+, normal complement, alopecia, hx of esophageal issues, concern for renal involvement with proteinuria, joint pain and swelling)   Rheumatology is consulted for evaluation of new Small Vessel Vasculitis in the setting of toe vasculitis .   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  Exam with discoloration under multiple toes, swelling of feet and ankles R>L and tenderness at the feet. To confirm etiology, would recommend would recommend investigating small vasculitis panels, echo to rule out endocarditis/cardioembolic phenomena, APLS panel for consideration of hypercoagulable causes. Overall presentation is concerning for necrosis/hypoperfusion. 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 As such we are recommending pulsed steroids for treatment of variable vessels vasculitis (including small vessel) given severity of patient's presentation.  Would of course rule out other embolic or hypercoagulable. Appreciate derm's input for further evaluation.    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 Subjective history of fatigue, fevers, sweats, and weight loss. Denies Fhx. Exam notable for alopecia, nail-bed changes, necrotic appearing lesions. There is concern with the history of proteinuria if the pt is having renal involvement from SLE. If the below urine studies suggest ongoing proteinuria would recommend kidney biopsy. In terms of lab workup, I would recommend determining activity of rheumatologic disease by acquiring the following RF, CCP, repeat ANCA titer, cryoglobulins (x3 days daily), lupus anticoagulant, small vessels vasculitis panel, and glycoprotein. Please also acquire studies to better quantify proteinuria including UA, UPC, Urine albumin/creatinine ratio, and UDS. Please stop dexamethasone  and start IV solumedrol 500 mg daily for the next 3 days. Also recommend continuing plaquenil  at 400 mg daily.   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 At this time would like to defer starting PLEX as the data around it is mixed in regards to utility in treatment. Pt's ferritin is most recently 365 markedly improved from prior hospitalization. Of note, blood smears during the same hospitalization were found to have 1+ schistocytes with normal haptoglobin.   RECOMMENDATIONS:   Recs: - Labs, Blood tests : lupus anticoagulant, cardiolpin, glycoprotein  - Small Vessel vesiculitis panel  - ANCA titer, RF, CCP, cryoglobulins (daily for 3 days)  - Urine UA, UPC, UACR, UDS.  - Echo for  ruling out cardioembollic phenomena. - consult renal for consideration of biopsy and derm for evaluation of the toes in the AM    Treatment:  - Continue plaquenil  400 mg daily  - Start IV solumedrol 500 mg daily x 3 days  - Stop dexamethasone    Thank you for involving us  in the care of your patient. Please contact the rheumatology pager at [phone] with additional questions or concerns.  Larey Found MD Ophthalmology PGY1   ------------------------------------------------------------------------------- Attestation signed by Charlanne Laverda Brink, MD at 01/08/2025  9:00 PM (Updated) Attestation Statement:   I personally saw and evaluated the patient, and participated in the management and treatment plan as documented in the resident/fellow note.  S: The patient was seen and examined at the bedside. This is a 24 year old gentleman with a history of alpha thalassemia, prior esophageal candidiasis in the fall of 2025, status post three weeks of Diflucan, scoliosis, who is presenting to the hospital today as a transfer from an outside hospital for bilateral lower extremity swelling, discoloration, and pain in his bilateral toes concerning for ischemia.  The patient reports that several months ago, he started having polyarthralgias in his hip and then subsequent joints. He started having significant weight loss of 40 pounds with poor appetite,  nausea, and vomiting. He had a workup notable for esophageal candidiasis, for which he did three weeks of Diflucan. He had a further course complicated by possible epididymitis with antibiotics, abdominal imaging concerning for a dilated appendix. He reports that he was recently in the hospital for 8 days from January 5th to January 26th as for weakness, abdominal pain, nausea, and hematemesis. It was noted that he had a significantly elevated ferritin as high as 50,000 with low fibrinogen, elevated transaminases in the several thousands.  The patient had multiple imaging studies that were done. A CT chest, abdomen, pelvis was notable for severe nonspecific gallbladder wall thickening. There were small volume ascites and mild mesenteric lymphadenopathy that was seen. He also had an MRCP that was notable for similar findings as well. He was ultimately treated with IV dexamethasone  for concern of HLH recently and was sent home on a dexamethasone  taper with rheumatology follow-up.  The patient has been followed by an outpatient rheumatologist and is noted to have a high titer ANA 1:1280 speckled, positive Smith, positive double-strand DNA, positive anti-Ro, positive U1 RNP as well. The working diagnosis is lupus, and he has been placed on Plaquenil  with the plan to start CellCept in the outpatient setting.   O: The patient was seen and examined at the bedside. He was noted to have abnormal nail fold capillaroscopy in toes with dark discoloration with some coldness at the plantar aspect with some skin breakdown. See the media tab for further details.  A/P: In terms of the etiology of patient symptoms, he is noted to have toe discoloration, ankle swelling. There is concern about vasculitis possibly in his toes, so we'll recommend doing a further workup for vasculitis, which can be seen in lupus. The patient also needs to be assessed for hypercoagulable causes. We will send antiphospholipid labs on him. There is  also concern for an embolic phenomenon. We'll also recommend a TTE. We will also recommend doing imaging as below to further workup if there is vasculitis or emboli elsewhere. Etiology is to also consider thromboangiitis obliterans. However, this seems less likely given that the patient is not a smoker, but this is certainly in the differential. Would also consider antiphospholipid-related thrombosis, and there may  be an underlying malignancy as well.   In terms of the patient's lupus, the patient seems to have active lupus at this time given that he has ongoing alopecia as described. The patient may have an overlap syndrome given that he has RNP positivity, so there may be other proccesses contributing to his symptoms. Prior urine studies were notable for proteinuria. 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 Similarly, the HLH presentation may have been related to SLE and the ascites that has been noted can be seen in SLE. For this reason, we'll start pulse steroids as below. There was also concern for macrophage activation syndrome for the patient. However, this does not seem to be the case today as his ferritin has remarkably improved into the couple hundreds. He also has normal fibrinogen, and his LFTs are significantly improved, and he does not appear to be in extremis.  Plan: - Check ANA, ENA panel - Check complements (C3/C4) - Check ANCA, small vessel vasculitis panel, rheumatoid factor, CCP, cryoglobulin for 3 days, urine analysis, urine protein creatinine ratio, urine albumin creatinine ratio, urine drug screen, TTE for ruling out cardioembolic phenomenon. - anticardiolipin IgG, IgM; lupus anticoagulant; B2GP1 IgG, IgM - Would recommend renal to be consulted for consideration of a biopsy once his urine studies have resulted  - dermatology for further evaluation of the toes with bx. - Would also recommend vascular surgery to be involved for  evaluation of the toes and see if there was any concern for perfusion issues from their standpoint. May be worth considering an angiogram to evaluate for vasculitis or other perfusion issues if CTA imaging is unrevealing - Would also do a CTA chest, abdomen, pelvis angio to evaluate for vasculitis elsewhere. - continue Plaquenil  400 mg daily.  - Start  IV Solu-Medrol  500 mg daily X 3 days. We'll likely transition to Solu-Medrol  60 mg daily thereafter, which is roughly 1 mg per kg for this patient, and stop dexamethasone .  - Further immunosuppression will depend on the patient's course and further workup. Rheumatology will continue to follow along.  Laverda Bring, MD Assistant Professor, Rheumatology  I spent a total of 75 minutes in both face-to-face and non-face-to-face activities, excluding procedures performed, for this visit on the date of this encounter.    -------------------------------------------------------------------------------

## 2025-01-08 NOTE — ED Provider Notes (Signed)
 Patient signed out to me by Dr. Rogelia.  This is a complicated patient.  Patient recently hospitalized at [facility].  Patient was admitted with lactic acidosis and rash, felt to be secondary to lupus.  Since discharge, patient has had progressively worsening discoloration of his toes.  Patient did follow-up with outpatient rheumatology here in [place] after discharge.  He has been on steroids without improvement.  With his worsening condition despite outpatient management, it was recommended that he go to an emergency department to be admitted for inpatient rheumatology evaluation.  The outpatient team did attempt to get [facility] involved but there were no beds.  Patient was then referred to the emergency department here.  Unfortunately we do not have rheumatology.  Patient did have CT angiography performed and he does not have any large vessel occlusion.  This is consistent with the outpatient team's concern that this is microvascular vasculitis causing symptoms.  [facility] had been consulted.  I did receive the callback by Dr. Morgan [place].  She reported that the patient would be placed on the list for transfer to [facility] as long as I recontacted [facility] to make sure that they did not have the ability to take care of this patient.  I did directly contact [facility] and they confirmed that they do not have inpatient rheumatology.  [facility] transfer services was updated and patient placed on transfer list to be transferred to [facility] when a bed is available.   Haze Lonni PARAS, MD 01/08/25 [phone]

## 2025-01-08 NOTE — ED Provider Notes (Signed)
 Emergency Medicine Observation Re-evaluation Note  Alfonza Toft is a 24 y.o. male, seen on rounds today.  Pt initially presented to the ED for complaints of Foot Pain and discolored toes  Currently, the patient is still having foot pain.  Physical Exam  BP (!) 134/100   Pulse (!) 107   Temp 98.1 F (36.7 C)   Resp 20   Ht 6' 6 (1.981 m)   SpO2 95%   BMI 19.07 kg/m  Physical Exam General: No acute distress MSK: DP pulses 2+ bilaterally.  Dorsum of the feet appear warm and well-perfused.  Blistering and blue-colored toes bilaterally  ED Course / MDM  EKG:   I have reviewed the labs performed to date as well as medications administered while in observation.  Recent changes in the last 24 hours include accepted by [facility].  Is awaiting transport at this point in time.  Has been given steroids but is still in significant pain so we will give an additional dose now.  Plan  Current plan is for pain medication.  Transferred to [facility].    Yolande Lamar BROCKS, MD 01/08/25 [phone]

## 2025-01-08 NOTE — Consult Note (Signed)
 " ED Consult    Reason for Consult:  purple toes Referring Physician:  Dr. Haze MRN #:  [id number]  History of Present Illness: This is a 24 y.o. male states that he has of lupus.  He is here today with discoloration of the toes on both of his feet which has been progressive and worsened after recent hospitalization at [facility].  He has associated swelling.  He has never had pain or discoloration in the past.  States that he has severe pain particularly on the bottom of his feet.  He is not having any other leg issues at this time.  He denies personal or family history of clotting disorder.  No previous lower extremity procedures.  He does take aspirin daily.  Past Medical History:  Diagnosis Date   ADD (attention deficit disorder)    Allergy    Anemia    Esophageal candidiasis (HCC) 09/14/2024   Migraine    Scoliosis     Past Surgical History:  Procedure Laterality Date   CIRCUMCISION     EYE SURGERY     for sty removal   HERNIA REPAIR     UMBILICAL HERNIA REPAIR Right 05/28/2020   Procedure: LAPARASCOPIC RIGHT INGUINAL HERNIA REPAIR;  Surgeon: Chuckie Casimiro KIDD, MD;  Location: [facility] OR;  Service: Pediatrics;  Laterality: Right;    Allergies[1]  Prior to Admission medications  Medication Sig Start Date End Date Taking? Authorizing Provider  amLODipine  (NORVASC ) 5 MG tablet Take 1 tablet (5 mg total) by mouth daily. 01/04/25  Yes Rice, Lonni ORN, MD  aspirin EC 81 MG tablet Take 81 mg by mouth daily. Swallow whole.   Yes [provider]  dexamethasone  (DECADRON ) 6 MG tablet Take 6 mg by mouth 2 (two) times daily. 01/02/25 02/13/25 Yes [provider]  gabapentin  (NEURONTIN ) 100 MG capsule Take 1 capsule (100 mg total) by mouth 2 (two) times daily. Patient taking differently: Take 100 mg by mouth 2 (two) times daily as needed (pain). 01/04/25  Yes Rice, Lonni ORN, MD  ketoconazole  (NIZORAL ) 2 % shampoo Apply 1 Application topically 2 (two) times a week. 10/15/24   Yes Jerrell Cleatus Ned, MD    Social History   Socioeconomic History   Marital status: Single    Spouse name: Not on file   Number of children: Not on file   Years of education: Not on file   Highest education level: 12th grade  Occupational History   Not on file  Tobacco Use   Smoking status: Never    Passive exposure: Never   Smokeless tobacco: Never  Vaping Use   Vaping status: Never Used  Substance and Sexual Activity   Alcohol use: No   Drug use: No   Sexual activity: Never  Other Topics Concern   Not on file  Social History Narrative   Carnie is a 6th grade student at [employer]; he does very well in school. He lives with his mother and twin brothers. He enjoys basketball, running, and video games.   Social Drivers of Health   Tobacco Use: Low Risk (01/07/2025)   Patient History    Smoking Tobacco Use: Never    Smokeless Tobacco Use: Never    Passive Exposure: Never  Financial Resource Strain: Low Risk (09/13/2024)   Overall Financial Resource Strain (CARDIA)    Difficulty of Paying Living Expenses: Not hard at all  Food Insecurity: No Food Insecurity (12/25/2024)   Received from [facility]  Within the past 12 months, you worried that your food would run out before you got the money to buy more.: Never true    Within the past 12 months, the food you bought just didn't last and you didn't have money to get more.: Never true  Transportation Needs: No Transportation Needs (12/26/2024)   Received from [facility]    In the past 12 months, has lack of transportation kept you from medical appointments or from getting medications?: No    In the past 12 months, has lack of transportation kept you from meetings, work, or from getting things needed for daily living?: No  Physical Activity: Inactive (09/13/2024)   Exercise Vital Sign    Days of Exercise per Week: 0 days    Minutes of Exercise per Session: Not on file  Stress: No Stress  Concern Present (12/25/2024)   Received from [facility] - Occupational Stress Questionnaire    Do you feel stress - tense, restless, nervous, or anxious, or unable to sleep at night because your mind is troubled all the time - these days?: Only a little  Social Connections: Socially Isolated (09/13/2024)   Social Connection and Isolation Panel    Frequency of Communication with Friends and Family: More than three times a week    Frequency of Social Gatherings with Friends and Family: Patient declined    Attends Religious Services: Never    Database Administrator or Organizations: No    Attends Engineer, Structural: Not on file    Marital Status: Never married  Intimate Partner Violence: Not At Risk (12/24/2024)   Received from [facility]   HITS    Over the last 12 months how often did your partner physically hurt you?: Never    Over the last 12 months how often did your partner insult you or talk down to you?: Never    Over the last 12 months how often did your partner threaten you with physical harm?: Never    Over the last 12 months how often did your partner scream or curse at you?: Never  Depression (PHQ2-9): Medium Risk (09/14/2024)   Depression (PHQ2-9)    PHQ-2 Score: 5  Alcohol Screen: Not on file  Housing: Low Risk (12/25/2024)   Received from [facility]    In the last 12 months, was there a time when you were not able to pay the mortgage or rent on time?: No    In the past 12 months, how many times have you moved where you were living?: 0    At any time in the past 12 months, were you homeless or living in a shelter (including now)?: No  Utilities: Not At Risk (12/25/2024)   Received from [facility]    In the past 12 months has the electric, gas, oil, or water company threatened to shut off services in your home?: No  Health Literacy: Not on file     Family History  Problem Relation Age of Onset    Migraines Brother    Migraines Mother    Diabetes Maternal Grandmother    Heart disease Maternal Grandmother    Hypertension Maternal Grandmother    Stroke Maternal Grandfather    Kidney failure Paternal Grandmother    Kidney disease Paternal Grandmother    Cancer Paternal Grandfather    Review of Systems  Constitutional: Negative.   HENT: Negative.  Eyes: Negative.   Respiratory: Negative.    Cardiovascular:  Positive for leg swelling.  Gastrointestinal: Negative.   Musculoskeletal:        Bilateral foot pain with toe discoloration  Endo/Heme/Allergies: Negative.   Psychiatric/Behavioral: Negative.         Physical Examination  Vitals:   01/08/25 0449 01/08/25 0459  BP: 134/88 134/88  Pulse: 90 90  Resp: 18 18  Temp: 98.1 F (36.7 C) 98.1 F (36.7 C)  SpO2: 100% 100%   Body mass index is 19.07 kg/m.  Physical Exam Constitutional:      Appearance: He is ill-appearing.  HENT:     Head: Normocephalic.     Nose: Nose normal.  Eyes:     Pupils: Pupils are equal, round, and reactive to light.  Cardiovascular:     Rate and Rhythm: Normal rate.     Pulses:          Popliteal pulses are 2+ on the right side and 2+ on the left side.       Dorsalis pedis pulses are 2+ on the right side and 2+ on the left side.  Pulmonary:     Effort: Pulmonary effort is normal.  Abdominal:     General: Abdomen is flat.     Palpations: Abdomen is soft.  Musculoskeletal:     Right lower leg: Edema present.     Left lower leg: Edema present.  Skin:    General: Skin is warm.     Capillary Refill: Capillary refill takes less than 2 seconds.  Neurological:     General: No focal deficit present.     Mental Status: He is alert.  Psychiatric:        Mood and Affect: Mood normal.        Thought Content: Thought content normal.      CBC    Component Value Date/Time   WBC 23.0 (H) 01/07/2025 1805   RBC 4.53 01/07/2025 1805   RBC 4.53 01/07/2025 1805   HGB 9.7 (L)  01/07/2025 1805   HCT 31.5 (L) 01/07/2025 1805   PLT 272 01/07/2025 1805   MCV 69.5 (L) 01/07/2025 1805   MCH 21.4 (L) 01/07/2025 1805   MCHC 30.8 01/07/2025 1805   RDW 25.2 (H) 01/07/2025 1805   LYMPHSABS 3.0 01/07/2025 1805   MONOABS 2.7 (H) 01/07/2025 1805   EOSABS 0.0 01/07/2025 1805   BASOSABS 0.1 01/07/2025 1805    BMET    Component Value Date/Time   NA 140 01/07/2025 1805   K 4.2 01/07/2025 1805   CL 105 01/07/2025 1805   CO2 24 01/07/2025 1805   GLUCOSE 101 (H) 01/07/2025 1805   BUN 22 (H) 01/07/2025 1805   CREATININE 0.76 01/07/2025 1805   CREATININE 0.71 09/14/2024 1443   CALCIUM 8.9 01/07/2025 1805   GFRNONAA >60 01/07/2025 1805   GFRAA NOT CALCULATED 07/09/2011 1630    COAGS: Lab Results  Component Value Date   INR 0.9 01/07/2025     Non-Invasive Vascular Imaging:   CTA BILATERAL LOWER EXTREMITY 01/07/2025 08:12:14 PM   TECHNIQUE: Without and with contrast-enhanced computed tomography angiography of the lower extremity was performed with multiplanar reconstructions. Maximum intensity projection images were created on a separate workstation and reviewed. Automated exposure control, iterative reconstruction, and/or weight based adjustment of the mA/kV was utilized to reduce the radiation dose to as low as reasonably achievable. 100 mL (iohexol  (OMNIPAQUE ) 350 MG/ML injection 100 mL IOHEXOL  350 MG/ML SOLN) was administered.  COMPARISON: None available.   CLINICAL HISTORY: Lower extremity vasculitis, known or suspected.   FINDINGS:   ARTERIAL: No arterial narrowing/stenosis. Patent 3 vessel runoff to the lower calf bilaterally, with diminished contrast distally due to timing/phase of contrast.   LEFT COMMON ILIAC ARTERY: No significant stenosis or vessel occlusion.   LEFT EXTERNAL ILIAC ARTERY: No significant stenosis or vessel occlusion.   COMMON FEMORAL ARTERY: No significant stenosis or vessel occlusion.   SUPERFICIAL FEMORAL  ARTERY: No significant stenosis or vessel occlusion.   POPLITEAL ARTERY: No significant stenosis or vessel occlusion.   TIBIOPERONEAL TRUNK: No significant stenosis or vessel occlusion.   ANTERIOR TIBIAL ARTERY: Patent flow to the lower calf bilaterally, with diminished contrast distally due to timing/phase of contrast.   PERONEAL ARTERY: Patent flow to the lower calf bilaterally, with diminished contrast distally due to timing/phase of contrast.   POSTERIOR TIBIAL ARTERY: Patent flow to the lower calf bilaterally, with diminished contrast distally due to timing/phase of contrast. No significant stenosis or vessel occlusion.   BONES AND SOFT TISSUES: No significant osseous or soft tissue abnormalities seen within the field of view.   IMPRESSION: 1. Patent three-vessel runoff to the lower calf bilaterally. 2. No lower extremity vasculitis. No arterial narrowing/stenosis.   ASSESSMENT/PLAN: This is a 24 y.o. male with bilateral foot pain and toe discoloration but with palpable pedal pulses and CTA that does not demonstrate any periods of arterial insufficiency.  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  Gimena Buick C. Sheree, MD [facility] Office: [phone] Pager: [phone]     [1]  Allergies Allergen Reactions   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   "

## 2025-01-08 NOTE — Consults (Signed)
 Benign Hematology Consult Note   Date of Consult:  01/08/2025  Service Requesting Consult: Internal Medicine  Current Hospital Day: Hospital Day: 1  SUBJECTIVE: Reason for Consultation: plasma exchange   History of Present Illness:   This is a 24 y.o. male w/ history of SLE on plaquenil , migraines, unintentional weight loss, alpha thalassemia minor trait, esophageal candidiasis s/p three weeks of diflucan who was transferred over from an OSH for lower extremity toe pain/discoloration concerning for necrosis, admitted to [facility] for rheumatology evaluation and potential plasma exchange.   He was in his usual state of health until last September he noted joint pain which was waking him up at night. He denies ever having a blood clot issue. He was also experiences severe migraine headaches prompting multiple ED visits. Also had an episode of epididymitis in the interim and was given antibiotics. He was experiencing hair loss and rash on his forehead and scalp. At some point he was diagnosed with esophageal candidiasis s/p EGD and was treated with diflucan. He also had ~40 lb weight loss. Overall, his symptoms were concerning for lupus per his PCP who sent him to a rheumatologist, patient was started on plaquenil . Other meds include dexamethasone , gabapentin , amlodipine , aspirin. Several lupus labs were positive including: ANA 1:1280 speckled, anti-Sm 51, anti-dsDNA 14.6, anti-Ro 21, RF 21, elevated anti-U1 RNP, +P-ANCA. Normal anti-CCP, C3/C4 compliment    Earlier this year patient presented to [facility] health w/ abdominal pain, weakness, N/V, was discharged with course of dexamethasone  (workup should markedly elevated Lfts and ferritin). Toe discoloration was noted during this hospitalization. Over last few days to weeks, patient and family noticed worsening swelling and discoloration of his foot. Patient reported worsening pain and had diffuculty ambulating.  PCP was concerned w/ worsening  discoloration/symptoms and therefore sent him to the hospital [facility]) for additional evaluation. Per Vascular surgery at OSH, no concern for large vessel issues. [facility] attending consulted at 2 am for emergent plex. Patient was transferred for rheumatologic and hematologic evaluation.   Family history is negative for any major medical illness, He denies tobacco use, illicit substance use. Denies sexual activity or history of STDs.   Other than foot discoloration and pain and symptoms listed above, he denies abd pain, chest pain, SOB, current headache, urinary issues, weakness. He notes easy bruising while on steroids,   Patient Active Problem List  Diagnosis   SLE (systemic lupus erythematosus) (CMS/HHS-HCC)   MAS (macrophage activation syndrome) (CMS/HHS-HCC)   Vasculitis ()   Elevated ferritin     No past medical history on file. No past surgical history on file. No family history on file. Social History:  Social History   Tobacco Use   Smoking status: Not on file   Smokeless tobacco: Not on file  Substance Use Topics   Alcohol use: Not on file     Allergies  Allergen Reactions   Blueberry Shortness Of Breath and Itching    Hasn't had severe reaction since childhood      Medications:  Reviewed   Review of Systems: 12 Point ROS negative other than that mentioned above in HPI.     OBJECTIVE: Temp:  [36.9 C (98.4 F)-37.1 C (98.8 F)] 36.9 C (98.4 F) Heart Rate:  [97-151] 151 Resp:  [18-24] 24 BP: (127-132)/(87-102) 127/87  Temp (24hrs), Avg:37 C (98.6 F), Min:36.9 C (98.4 F), Max:37.1 C (98.8 F)  AO x 3, NAD  RRR, soft murmur  Lungs clear  Abd NT/ND  +peripheral edema +digit discoloration  of lower extremities   Recent Labs  Lab 01/08/25 1220  WBC 27.8*  HGB 9.6*  HCT 30.1*  MCV 68*  MCHC 31.9  PLT 269   Recent Labs  Lab 01/08/25 1220  NA 138  K 4.0  CL 101  CO2 27  BUN 11  CREATININE 0.7  GLUCOSE 110  CALCIUM  8.6*  MG 1.4*   Recent Labs  Lab 01/08/25 1220  ALKPHOS 82  TBILI 1.6*  ALT 140*  AST 40   Recent Labs  Lab 01/08/25 1220  LDH 161  TBILI 1.6*   Recent Labs  Lab 01/08/25 1220  FERRITIN 365*   Recent Labs  Lab 01/08/25 1221  INR 1.0  APTT 30.4      ASSESSMENT  This is a 24 y.o. male w/ history of SLE on plaquenil , migraines, unintentional weight loss, alpha thalassemia minor trait, esophageal candidiasis s/p three weeks of diflucan who was transferred over from an OSH for lower extremity toe pain/discoloration concerning for necrosis, admitted to [facility] for rheumatology evaluation and potential plasma exchange.   At this time we do not have clear indication to plasma exchange this patient. He had very low titer anti-cardiolipin antibody which makes catastrophic APLS less likely, although we recommend sending full panel antibody testing (see below). Initially, there was concern for HLH in the setting very high ferritin and LFTs, although he did not quite meet criteria for [facility] based on 2024 consensus: Fever, splenomegaly, greater than 2 cytopenias, high ferritin, hemophagocytosis, elevated triglycerides or low fibrinogen, and elevated sCD25. His ferritin and LFTs are coming down s/p dex, sIL2 is low, additionally his HLH score is 111 (3-5% probability of HLH). Thus, we have low suspicion for HLH at this time.   With regards to his lower extremity digit pain and c/f necrosis, differential diagnosis is broad but could include cryoglobulins, microthrombi 2/2 to APLS, versus small vessel vasculitides, rheumatologic condition like adult onset still's disease/lupus, or medication related. We therefore recommend sending blood tests for APLS labs and cryoglobulins. Appreciate rheumatologic input and would highly recommend biopsy of toes for presence of vasculitis. Would also recommend MRI brain to rule out manifestation of systemic inflammation in CNS in the setting of ongoing headaches,  brain fog. The patient's height and body habitus seems marfan like and therefore we recommend echocardiogram to rule out aortic arch or valvular pathologies. Overall we have low suspicion for primary hematologic process (no overt signs of ongoing hemolysis, malignancy, worsening inflammatory state that could suggest HLH).      #Lower extremity digit discoloration/necrosis   #Anemia with normal red blood cell count  - low suspicion for primary hematologic process  - please send following labs if not already done so:  - cryoglobulins, anti-cardiolipin, Lupus anticoagulant, anti-beta2 glycoprotein, hepatitis labs, HIV, ferritin, DAT, LDH, Haptoglobin, reticulocytes, Hg electrophoresis, copper, zinc, vitamin b12, MMA, homocysteine, fibrinogen level   - obtain liver/spleen ultrasound  - please obtain echocardiogram  - MRI brain  - we will review the blood smear  - derm consult for toe biopsy  - appreciate rheumatology input   Thank you for this consultation and for allowing us  to participate in the care of this patient.  The plan above was discussed with the attending Hematologist, Dr. Perry.  Please page [phone] with questions.  Trenda Labella MD, PhD  Hematology and Oncology, PGY-4 Benign Hematology Fellow: [phone] 2:33 PM     ------------------------------------------------------------------------------- Attestation signed by Perry Kipper, MD at 01/08/2025  4:25 PM Attestation Statement:  I personally saw and evaluated the patient, and participated in the management and treatment plan as documented in the resident/fellow note.  Patient was seen in the presence of hem team as well as rheum resident.  The nature of the discoloration of the bilateral feet is not clear but large vessel disease not thought to be the issue as per evaluation by vascular.  However, there seems to be a change in the patient's general health condition since the summer and acutely worsening since the last 3  months or so.    The patient seems to be slightly slow to respond to questions making the question of possible vasculitis not only int he feet but possibly in the brain as well and we recommend imaging of the brain.  He is also extremely tall (65') with parents that are 5'8 and 5'9 bringing to question if he is suffering from Marfan's and therefore echo may be useful in evaluation -- this may be especially important if the patient requires prolonged steroids as it can weaken the connective tissue that can affect the cardiovascular structures.  The initially elevated ferritin to 50K which improved with steroids down to 2K  is usually due either to [facility] (this patient does not appear to have symptoms concerning for [facility]) or adult onset still's which may be consistent with his labs and presentation -- rheumatology may need to weigh in on this possibility.  Labs as per the fellow's note.   We will follow closely with you  Thank you for allowing us  to participate in the care of this patient,  Derick Awkward, MD Attending, Benign Hematology   DERICK AWKWARD, MD  -------------------------------------------------------------------------------

## 2025-01-08 NOTE — ED Notes (Signed)
 [facility] call received with pt placement info and accepting physician, [facility] called and will not be able to transport pt. Until after 7 am

## 2025-01-08 NOTE — ED Notes (Signed)
 Called [facility] FOR PT PICK UP THEY ARE STILL WAITING ON TRUCKS, NO TRUCKS AVAILABLE.

## 2025-01-08 NOTE — H&P (Signed)
 "  Hospital Medicine Admission History & Physical  Time of Service: 01/08/2025, 7:35 PM  PCP: Robynn Hadassah Molly, MD, Phone [phone], Fax [phone]   Chief Complaint  Toe pain, discoloration, LE swelling  History of Present Illness  Zachary Castaneda is a 24 y.o. 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  The patient reports that he was in his usual state of health until several months ago when he started to notice pain in his L shoulder and elbow and had substantial unintentional weight loss over that same time period. No prior history of rashes, mucositis, or other symptoms. He underwent prior autoimmune workup with [facility] as an outpatient with ANA 1:1280 (speckled) elevated anti-Smith, and anti-dsDNA antibodies with a presumptive diagnosis of SLE. He was started on plaquenil  as an outpatient, but noticed more symptomatic relief with daily prednisone.   He recently presented to OSH on 12/24/24 for weakness, abd pain, nausea, and hematemesis. Workup was notable for a ferritin level of ~50,000, anemia, thrombocytopenia, mild LFT elevation. 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  He was noted to have reduced LE pulses and discoloration on ~1/11 but duplex did not demonstrate clots/stenosis. Discharged home on dex taper 1/13.   Yesterday he re-presented to an OSH with progressive discomfort in his LE and toes, as he was now unable to walk. He was transferred to [facility] for further evaluation and consideration of possible plasmapheresis.  Per patient, he has noticed swelling of his bilateral LE that started ~3 weeks ago and has become  progressively worse. Around that same time he began noticing small areas of discoloration on his toes and noticed an achy discomfort. His pain became progressively worse over that period of time, with temporary sharp/burning pains. He noted that his pain was exacerbated when the swelling was worse, and he had temporary relief by elevating his feet. He was able to walk until yesterday when he noticed some worsening pain with ambulation. He notes intermittent numbness in his feet but mostly intact sensation. He had previously trialed gabapentin  but has not noticed much relief.   The patient denies fevers, chills, congestion, cough, shortness of breath, chest pain, rashes, other skin/oral lesions, or other sensory changes. He denies recent joint pain/swelling (outside of his feet). He denies N/V, abd pain, constipation, or diarrhea.   At time of transfer, vitals were T 37.1 C (98.8 F), HR 97, BP (!) 132/102, RR 18, and satting 98 % on RA. Labs notable for Na 138, K 4.0, HCO3 27, BUN 11, sCr 0.7. CBC notable for WBC 27.8*, Hgb 9.6*, plts 269.   SHx: Pt lives with his father in [place]. Attends college locally, studies Lobbyist. Denies prior tobacco use, no EtOH use, no substance use. Patient has no history of sexual activity.  FHx: Denies family history of other autoimmune conditions including IBD, RA, SLE, etc. His brother has asthma. Noted his maternal GM died of heart disease.  Medical History  Past Medical History No past medical history on file.  Past Surgical History No past surgical history on file.  Family History No family history on file.  Social History Social History   Socioeconomic History   Marital status: Single  Tobacco  Use   Smoking status: Never   Smokeless tobacco: Never  Vaping Use   Vaping status: Unknown   Social Drivers of Corporate Investment Banker Strain: Low Risk  (01/08/2025)   Overall Financial Resource Strain (CARDIA)    Difficulty of  Paying Living Expenses: Not hard at all  Food Insecurity: No Food Insecurity (01/08/2025)   Hunger Vital Sign    Worried About Running Out of Food in the Last Year: Never true    Ran Out of Food in the Last Year: Never true  Transportation Needs: Unknown (01/08/2025)   PRAPARE - Administrator, Civil Service (Medical): No    Allergies & Medications   Allergies  Allergen Reactions   Blueberry Shortness Of Breath and Itching    Hasn't had severe reaction since childhood     Medications Prior to Admission Medications  Prescriptions Last Dose Taking?  amLODIPine  (NORVASC ) 5 MG tablet 01/08/2025 Yes  Sig: Take 5 mg by mouth once daily  aspirin 81 MG EC tablet Past Month Yes  Sig: Take 81 mg by mouth once daily  dexAMETHasone  (DECADRON ) 6 MG tablet 01/08/2025 Yes  Sig: Take 6 mg by mouth 2 (two) times daily with meals  gabapentin  (NEURONTIN ) 100 MG capsule Past Month Yes  Sig: Take 100 mg by mouth 2 (two) times daily    Facility-Administered Medications: None     Review of Systems  A complete review of systems was performed and is negative except as reviewed in the HPI.  Physical Exam    Current Vital Signs 24h Vital Sign Ranges  T 37.3 C (99.1 F) (01/08/25 1829) Temp  Avg: 37.1 C (98.8 F)  Min: 36.9 C (98.4 F)  Max: 37.3 C (99.1 F)  BP 124/82 (01/08/25 1829) BP  Min: 124/82  Max: 132/102  HR (!) 115 (01/08/25 1829) Pulse  Avg: 120.4  Min: 97  Max: 151  RR 18 (01/08/25 1829) Resp  Avg: 20  Min: 18  Max: 24  O2sat 100 %   SpO2  Avg: 99.3 %  Min: 98 %  Max: 100 %  Weight 67.6 kg (149 lb) (01/08/25 1603)   Body mass index is 17.67 kg/m. General: NAD, lying comfortably in bed. Tall and thin-appearing.  Eyes: EOMI, no scleral icterus HENT: No oral lesions, MMM CV/Chest: Intermittent tachycardia, has fluctuations in HR throughout exam. Reg rhythm, no m/r/g. Palpable PT and DP pulses bilaterally. Feet warm to touch. No pectus excavatum. Resp: Normal WOB on  RA, faint dry crackles bilaterally, no rhonchi, rales Abd: Non-tender, non-distended. Normal bowel sounds.  Ext: Has 2-3+ pitting edema of bilateral LE up to just below the knee.  Skin: Has small violaceous/purplish black patches with small areas of developing ulceration and occasional bulla distributed throughout toes bilaterally. Areas of mild erythematous streaking on dorsal feet bilaterally. No petechiae noted on exam. Small healing abrasion RUQ abd. No lesions or nodules noted on bilateral hands. Psych: Congruent mood/affect Neuro: Alert and oriented, no FND. Discomfort with palpation of bilateral feet but denies sensory change.  Data   Recent Results (from the past 24 hours)  ECG 12-lead   Collection Time: 01/08/25 12:16 PM  Result Value Ref Range   Vent Rate (bpm) 98    PR Interval (msec) 130    QRS Interval (msec) 80    QT Interval (msec) 354    QTc (msec) 451   Comprehensive Metabolic Panel (CMP)   Collection Time: 01/08/25 12:20 PM  Result Value  Ref Range   Sodium 138 135 - 145 mmol/L   Potassium 4.0 3.5 - 5.0 mmol/L   Chloride 101 98 - 108 mmol/L   Carbon Dioxide (CO2) 27 21 - 30 mmol/L   Urea Nitrogen (BUN) 11 7 - 20 mg/dL   Creatinine 0.7 0.6 - 1.3 mg/dL   Glucose 889 70 - 859 mg/dL   Calcium 8.6 (L) 8.7 - 10.2 mg/dL   AST (Aspartate Aminotransferase) 40 15 - 41 U/L   ALT (Alanine Aminotransferase) 140 (H) 15 - 50 U/L   Bilirubin, Total 1.6 (H) 0.4 - 1.5 mg/dL   Alk Phos (Alkaline Phosphatase) 82 24 - 110 U/L   Albumin 2.7 (L) 3.5 - 4.8 g/dL   Protein, Total 6.6 6.2 - 8.1 g/dL   Anion Gap 10 3 - 12 mmol/L   BUN/CREA Ratio 15 6 - 27   Glomerular Filtration Rate (eGFR)  134 mL/min/1.73sq m  Complete Blood Count (CBC) with Differential   Collection Time: 01/08/25 12:20 PM  Result Value Ref Range   WBC (White Blood Cell Count) 27.8 (H) 3.2 - 9.8 x109/L   Hemoglobin 9.6 (L) 13.7 - 17.3 g/dL   Hematocrit 69.8 (L) 60.9 - 49.0 %   Platelets 269 150 - 450 x109/L    MCV (Mean Corpuscular Volume) 68 (L) 80 - 98 fL   MCH (Mean Corpuscular Hemoglobin) 21.7 (L) 26.5 - 34.0 pg   MCHC (Mean Corpuscular Hemoglobin Concentration) 31.9 31.5 - 36.3 %   RBC (Red Blood Cell Count) 4.43 4.37 - 5.74 x1012/L   RDW-CV (Red Cell Distribution Width) 25.1 (H) 11.5 - 14.5 %   NRBC (Nucleated Red Blood Cell Count) 0.00 0 x109/L   NRBC % (Nucleated Red Blood Cell %) 0.0 %   MPV (Mean Platelet Volume)     Neutrophil Count 23.6 (H) 2.0 - 8.6 x109/L   Neutrophil % 84.8 (H) 37 - 80 %   Lymphocyte Count 1.1 0.6 - 4.2 x109/L   Lymphocyte % 3.8 (L) 10 - 50 %   Monocyte Count 2.2 (H) 0 - 0.9 x109/L   Monocyte % 7.9 0 - 12 %   Eosinophil Count 0.00 0 - 0.70 x109/L   Eosinophil % 0.0 0 - 7 %   Basophil Count 0.05 0 - 0.20 x109/L   Basophil % 0.2 0 - 2 %   Slide Review/Morphology Yes    Immature Granulocyte Count 0.91 (H) <=0.06 x109/L   Immature Granulocyte % 3.3 (H) <=0.7 %   Immature Platelet Fraction 6.3 1.6 - 8.5 %  Ferritin   Collection Time: 01/08/25 12:20 PM  Result Value Ref Range   Ferritin 365 (H) 11 - 204 ng/mL  Magnesium   Collection Time: 01/08/25 12:20 PM  Result Value Ref Range   Magnesium 1.4 (L) 1.8 - 2.5 mg/dL  Phosphorus   Collection Time: 01/08/25 12:20 PM  Result Value Ref Range   Phosphorus 4.3 2.3 - 4.5 mg/dL  Type And Screen   Collection Time: 01/08/25 12:20 PM  Result Value Ref Range   ABO RH TYPE O Positive    Antibody Screen Negative    Specimen Outdate 01-11-2025 23:59    Performing Lab [facility] BLOOD BANK LAB   C-Reactive Protein (CRP), Inflammatory   Collection Time: 01/08/25 12:20 PM  Result Value Ref Range   CRP (C-reactive Protein, Inflammatory) 1.27 (H) <=0.85 mg/dL  Lactate Dehydrogenase (LDH)   Collection Time: 01/08/25 12:20 PM  Result Value Ref Range   Lactate Dehydrogenase (LDH) 161 100 -  200 U/L  Prothrombin Time (INR)   Collection Time: 01/08/25 12:21 PM  Result Value Ref Range   Prothrombin Time 11.9 9.5 - 13.1  sec   Prothrombin INR 1.0 0.9 - 1.1  Activated Partial Thromboplastin Time (APTT)   Collection Time: 01/08/25 12:21 PM  Result Value Ref Range   Act Partial Thromboplastin Time 30.4 26.8 - 37.1 sec  Fibrinogen   Collection Time: 01/08/25 12:21 PM  Result Value Ref Range   Fibrinogen 368 213 - 435 mg/dL  Calcium, Ionized   Collection Time: 01/08/25 12:21 PM  Result Value Ref Range   Calcium, Ionized 1.16 1.15 - 1.32 mmol/L  Lupus Anticoagulant Panel   Collection Time: 01/08/25 12:21 PM  Result Value Ref Range   Prothrombin Time 11.9 9.5 - 13.1 sec   Prothrombin INR 1.0 0.9 - 1.1   Act Partial Thromboplastin Time 30.4 26.8 - 37.1 sec   Thrombin Clot Time 23.3 16.6 - 24.3 sec   Lupus Anticoagulant Panel Interpretation    Confirmatory ABO, RH   Collection Time: 01/08/25  1:24 PM  Result Value Ref Range   ABO RH TYPE O Positive   Anti-Cardiolipin (IgG,IgM,IgA)   Collection Time: 01/08/25  1:24 PM  Result Value Ref Range   Anti-Cardiolipin IgG <1.6 <20 GPL-U/mL   Anti-Cardiolipin IgM <1.5 <20 MPL-U/mL   Anti-Cardiolipin IgA <2.0 <20 APL-U/mL  Blood Film- Special Heme   Collection Time: 01/08/25  1:24 PM  Result Value Ref Range   Hematology Comment      Slide images available in [employer] for 7 days.  Note: White blood cells have been pre-sorted by the instrument but have not been reviewed by laboratory staff on blood film orders.  ECG 12-lead   Collection Time: 01/08/25  2:28 PM  Result Value Ref Range   Vent Rate (bpm) 112    PR Interval (msec) 150    QRS Interval (msec) 82    QT Interval (msec) 340    QTc (msec) 464   Bilirubin, Direct   Collection Time: 01/08/25  5:05 PM  Result Value Ref Range   Bilirubin, Conjugated 0.50 0.10 - 0.60 mg/dL    EKG: Sinus tachycardia  Radiology Studies on Admission: X-ray chest single view portable Result Date: 01/08/2025 Procedure: Single frontal view of the chest. Indication: Heart Size, Lung Aeration, Tube Position, Disease  Progression, M32.9 Systemic lupus erythematosus, unspecified (CMS/HHS-HCC) Comparison: None. IMPRESSION: 1. Borderline heart size. Mediastinal contours within normal limits. 2. Mild predominantly central bilateral pulmonary opacities concerning for mild edema. 3. No evidence of pneumothorax or pleural effusion. Electronically Signed by:  Alfonse Washington , MD, [facility] Electronically Signed on:  01/08/2025 1:34 PM  Request for image library services Result Date: 01/08/2025 Please refer to the appropriate PACS to view images.    Zachary Castaneda is a 24 y.o. 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    #Toe discoloration, necrosis #LE swelling #Prior diagnosis SLE #Leukocytosis #Marfanoid habitus Patient with recent presumptive diagnosis of SLE after several months of R shoulder, elbow, and hip joint pain and ~40 lb weight loss iso decreased po and vomiting. Multiple ED visits in fall 2025 for confluence of problems including HAs, epididymitis, dilated appendix. Prior workup notable for  ANA >1:1280 (speckled), positive anti-dsDNA (14.6), positive anti-Smith ab (51) - also positive anti-U1 RNP, anti-Ro, and p-ANCA. Normal C3/C4. Prior RPR, UDS, HIV, and HCV/HBV neg. Was treated as outpatient with prednisone and plaquenil  with improvement in joint pain. Now with ~3 week history of bilateral lower extremity swelling and progressive violaceous discoloration with new developing ulceration and pain in his bilateral toes suspicious for ischemic necrosis. Strong, palpable DP/PT pulses and warm feet bilaterally so low concern for large vessel occlusion (also recent duplex at OSH  without clot/stenosis)/ALI. Has significant leukocytosis to 27.8 without obvious infectious symptoms. Distribution of lesions and history of autoimmune workup most suggestive of small vessel vasculitis. Also considered possible cryoglobulinemia with deposition, however prior normal complement levels would be unusual. In addition to his serologic testing, he does have history of anemia/thrombocytopenia, proteinuria, and joint swelling which may align with SLE diagnosis vs. An unspecified/mixed CTD. Will eval for possible APLS, and additional evaluation to better categorize his ongoing disease process. Of note, patient is very tall, thin and has features of marfanoid habitus on exam. Will check homocysteine level. Discussed with Heme and no acute indication for plasmapheresis. Given reported headaches, will check brain MRI. 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 Hgb 9.6 on presentation which is slightly down from prior hospitalizations in mid to late 2025 (~11-12). MCV 68. Will check hgb electrophoresis. CMP with total bili elevation - will fractionate and eval for hemolysis. Also given profound weight loss over the past several months and poor po intake, will check nutritional labs.  - Hematology consulted, as above  - Haptoglobin, LDH, direct bili, DAT  - Copper, zinc, B12, MMA,  retic  - Hemoglobin electrophoresis  #Recent c/f MAS Ferritin >50,000 with anemia and thrombocytopenia during prior hospitalization at OSH 12/24/24-01/01/25. C/f MAS so patient was started on IV dexamethasone  20mg  daily with transition to po for 14 day course. Re-admitted prior to completion of full course. Ferritin 365 on presentation. No immediate indication for PLEX per Rheum.  - Rheum consulted, as above - IV solumedrol as above  #Tachycardia Patient has had intermittent tachycardia with HRs up to ~140s. Patient denies SOB, dizziness, CP (overall asymptomatic and well-appearing). EKG with sinus tach. Somewhat volatile HR which will also quickly drop to 90s. Given vasculitic process, certainly at high risk for blood clots/PE but has had no other vital signs changes, no SOB, no pleuritic pain, and no O2 requirement (on lovenox). Will defer CT-PE at this time, but low threshold. Possibly dehydrated, had some improvement after 1L LR. Patient is having pain so this could be a manifestation. Alternatively considered infection but no clear symptoms.  - S/p 1L LR - Continue tele, CTM - Tylenol  prn for pain - Consider CT-PE if persistent, vital signs changes, etc.  #Malnutrition Patient endorsed recent loss of up to 40 lbs over the past several months from poor  po intake and intermittent vomiting leading to prior hospitalizations in late 2025. BMI 17.7. Will check nutritional deficiencies as above (zinc, copper, B12, MMA). - Nutrition consulted, appreciate recs - Started thiamine supplementation - PT/OT  VTE prophylaxis: Lovenox  Code Status: Full Code  Patient Class & Status: Inpatient, Intermediate  Discharge Planning: Pending workup/clinical stabilization    Signe Remington, MD, PhD Internal Medicine Resident, PGY-1 Pager: [phone]  [facility] 01/08/2025 7:35 PM    ------------------------------------------------------------------------------- Attestation signed by Jolayne Wallis Fairly, MD at 01/08/2025  9:14 PM Attending Attestation   (FR) I confirm that I was present for the key and critical portions of the service, including a review of the patient's history and other pertinent data.  I personally examined the patient, and formulated the evaluation and/or treatment plan.  I have reviewed the note of the house staff and agree with the findings documented in the note, with any exceptions as noted below.  SLE with ischemic changes to toes and swelling to feet, some discoloration in fingertips developing as well. 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 Ferritin was sky high at the other hospital and is now normalizing, possible MAS.   Also 6'5 though played sports and exam without pectus excavatum however CXR with possible edema and with some pedal edema so will evaluate cardiac structure and function.  Thin with weight loss and underweight and recent difficulties eating. RD consult for likely moderate protein calorie malnutrition. Optimizing micronutrients. Volume repletion.  Anemia will evaluate for iron deficiency though noted to have an alphathalminor  Will need to further review imaging from OSH to understand the mild ascites considering serositis considering malnutrition less likely liver synthetic dysfunction  Considered vascular surgery consultation however no acute large vessel occlusion needing revascularization and had angio imaging at referring facility.  SUCHITA [facility] SATA, MD  ------------------------------------------------------------------------------- "

## 2025-01-09 LAB — C4 COMPLEMENT: Complement C4, Body Fluid: 17 mg/dL (ref 12–38)

## 2025-01-09 LAB — ANTI-DNA ANTIBODY, DOUBLE-STRANDED: ds DNA Ab: 1 [IU]/mL (ref 0–9)

## 2025-01-09 LAB — C3 COMPLEMENT: C3 Complement: 114 mg/dL (ref 82–167)

## 2025-01-10 ENCOUNTER — Telehealth: Payer: Self-pay

## 2025-01-10 NOTE — Telephone Encounter (Signed)
 Submitted a Prior Authorization request to [employer] for Tramadol  via CoverMyMeds. Will update once we receive a response.

## 2025-01-11 ENCOUNTER — Ambulatory Visit: Admitting: Internal Medicine

## 2025-01-11 NOTE — Progress Notes (Unsigned)
 "  Office Visit Note  Patient: Zachary Castaneda             Date of Birth: 2001-03-07           MRN: [id number]             PCP: Jerrell Cleatus Ned, MD Referring: Jerrell Cleatus Ned, MD Visit Date: 01/11/2025   Subjective:  No chief complaint on file.   History of Present Illness: Zachary Castaneda is a 24 y.o. male here for follow up ***   Previous HPI    No Rheumatology ROS completed.   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   Seborrheic dermatitis 10/12/2024   SLE (systemic lupus erythematosus) (HCC) 09/28/2024   Alpha thalassemia minor trait 09/14/2024   ADHD (attention deficit hyperactivity disorder), 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   Migraine without aura and without status migrainosus, not intractable 01/12/2016    Past Medical History:  Diagnosis Date   ADD (attention deficit disorder)    Allergy    Anemia    Esophageal candidiasis (HCC) 09/14/2024   Migraine    Scoliosis     Family History  Problem Relation Age of Onset   Migraines Brother    Migraines Mother    Diabetes Maternal Grandmother    Heart disease Maternal Grandmother    Hypertension Maternal Grandmother    Stroke Maternal Grandfather    Kidney failure Paternal Grandmother    Kidney disease Paternal Grandmother    Cancer Paternal Grandfather    Past Surgical History:  Procedure Laterality Date   CIRCUMCISION     EYE SURGERY     for sty removal   HERNIA REPAIR     UMBILICAL HERNIA REPAIR Right 05/28/2020   Procedure: LAPARASCOPIC RIGHT INGUINAL HERNIA REPAIR;  Surgeon: Chuckie Casimiro KIDD, MD;  Location: [facility] OR;  Service: Pediatrics;  Laterality: Right;   Social History   Social History Narrative   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 He lives with his mother and twin  brothers. He enjoys basketball, running, and video games.    There is no immunization history on file for this patient.   Objective: Vital Signs: There were no vitals taken for this visit.   Physical Exam   Musculoskeletal Exam: ***   Investigation: No additional findings.  Imaging: CT ANGIO LOWER EXT BILAT W &/OR WO CONTRAST Result Date: 01/07/2025 EXAM: CTA BILATERAL LOWER EXTREMITY 01/07/2025 08:12:14 PM TECHNIQUE: Without and with contrast-enhanced computed tomography angiography of the lower extremity was performed with multiplanar reconstructions. Maximum intensity projection images were created on a separate workstation and reviewed. Automated exposure control, iterative reconstruction, and/or weight based adjustment of the mA/kV was utilized to reduce the radiation dose to as low as reasonably achievable. 100 mL (iohexol  (OMNIPAQUE ) 350 MG/ML injection 100 mL IOHEXOL  350 MG/ML SOLN) was administered. COMPARISON: None available. CLINICAL HISTORY: Lower extremity vasculitis, known or suspected. FINDINGS: ARTERIAL: No arterial narrowing/stenosis. Patent 3 vessel runoff to the lower calf bilaterally, with diminished contrast distally due to timing/phase of contrast. LEFT COMMON ILIAC ARTERY: No significant stenosis or vessel occlusion. LEFT EXTERNAL ILIAC ARTERY: No significant stenosis or vessel occlusion. COMMON FEMORAL ARTERY: No significant stenosis or vessel occlusion. SUPERFICIAL FEMORAL ARTERY: No significant stenosis or vessel occlusion. POPLITEAL ARTERY: No significant stenosis or vessel occlusion.  TIBIOPERONEAL TRUNK: No significant stenosis or vessel occlusion. ANTERIOR TIBIAL ARTERY: Patent flow to the lower calf bilaterally, with diminished contrast distally due to timing/phase of contrast. PERONEAL ARTERY: Patent flow to the lower calf bilaterally, with diminished contrast distally due to timing/phase of contrast. POSTERIOR TIBIAL ARTERY: Patent flow to the lower calf bilaterally,  with diminished contrast distally due to timing/phase of contrast. No significant stenosis or vessel occlusion. BONES AND SOFT TISSUES: No significant osseous or soft tissue abnormalities seen within the field of view. IMPRESSION: 1. Patent three-vessel runoff to the lower calf bilaterally. 2. No lower extremity vasculitis. No arterial narrowing/stenosis. Electronically signed by: Pinkie Pebbles MD 01/07/2025 09:01 PM EST RP Workstation: [id number]    Recent Labs: Lab Results  Component Value Date   WBC 23.0 (H) 01/07/2025   HGB 9.7 (L) 01/07/2025   PLT 272 01/07/2025   NA 140 01/07/2025   K 4.2 01/07/2025   CL 105 01/07/2025   CO2 24 01/07/2025   GLUCOSE 101 (H) 01/07/2025   BUN 22 (H) 01/07/2025   CREATININE 0.76 01/07/2025   BILITOT 1.2 01/07/2025   ALKPHOS 118 01/07/2025   AST 53 (H) 01/07/2025   ALT 224 (H) 01/07/2025   PROT 7.1 01/07/2025   ALBUMIN 3.5 01/07/2025   CALCIUM 8.9 01/07/2025   GFRAA NOT CALCULATED 07/09/2011    Speciality Comments: No specialty comments available.  Procedures:  No procedures performed Allergies: Blueberry [berry]   Assessment / Plan:     Visit Diagnoses:  Assessment & Plan   ***  Follow-Up Instructions: No follow-ups on file.   Lonni LELON Ester, MD  Note - This record has been created using [employer].  Chart creation errors have been sought, but may not always  have been located. Such creation errors do not reflect on  the standard of medical care. "

## 2025-01-16 ENCOUNTER — Telehealth: Payer: Self-pay | Admitting: Student in an Organized Health Care Education/Training Program

## 2025-01-16 ENCOUNTER — Ambulatory Visit: Admitting: Neurology

## 2025-01-16 NOTE — Telephone Encounter (Signed)
 Type of form received: FMLA leave request  Additional comments:   Received by: Self  Form should be Faxed/mailed to: (address/ fax #)  Is patient requesting call for pickup: [phone]   Form placed:  Provider bin  Attach charge sheet.  Provider will determine charge.  Individual made aware of 3-5 business day turn around Yes?

## 2025-01-16 NOTE — Telephone Encounter (Signed)
 Called patient to discuss paper work, patient stated they did not request leave ended call and researching further

## 2025-01-17 NOTE — Telephone Encounter (Signed)
 Completed and placed in MA basket.  Thank you.

## 2025-01-17 NOTE — Telephone Encounter (Signed)
 [facility] and informed him this is complete, he states he will pick these up today or tomorrow

## 2025-01-17 NOTE — Telephone Encounter (Addendum)
 Did some further research last night and found that this paperwork is for the patients father who has been transporting him back and forth to the hospital for treatment for lupus  Called father and collected information below: dates of absence: Father Sheppard first absence was 01/07/2025 but is intermittent to take Kyo for treatment/care  reason for leave: Lemond (father) is caring for Thelma current symptoms:  expected duration: 3 Months at least  any limitations in daily activities or work duties: Lemond does not experience limitations while he is at work.   Placed forms on PCP desk for review, once completed patient Lemond needs a call back to pick up this paperwork [phone]

## 2025-01-21 ENCOUNTER — Telehealth: Payer: Self-pay

## 2025-01-21 ENCOUNTER — Inpatient Hospital Stay: Admitting: Student in an Organized Health Care Education/Training Program

## 2025-01-21 NOTE — Transitions of Care (Post Inpatient/ED Visit) (Signed)
 "  01/21/2025  Name: Zachary Castaneda MRN: [id number] DOB: 2001-12-03  Today's TOC FU Call Status: Today's TOC FU Call Status:: Successful TOC FU Call Completed TOC FU Call Complete Date: 01/21/25  Patient's Name and Date of Birth confirmed. Name, DOB  Transition Care Management Follow-up Telephone Call Date of Discharge: 01/18/25 Discharge Facility: Other ([facility]) Name of Other ([facility]) Discharge Facility: [facility] Type of Discharge: Inpatient Admission How have you been since you were released from the hospital?: Better Any questions or concerns?: No  Items Reviewed: Did you receive and understand the discharge instructions provided?: Yes Medications obtained,verified, and reconciled?: Yes (Medications Reviewed) Any new allergies since your discharge?: No Dietary orders reviewed?: Yes Do you have support at home?: Yes People in Home [RPT]: parent(s)  Medications Reviewed Today: Medications Reviewed Today     Reviewed by Emmitt Pan, LPN (Licensed Practical Nurse) on 01/21/25 at 1147  Med List Status: <None>   Medication Order Taking? Sig Documenting Provider Last Dose Status Informant  acetaminophen  (TYLENOL ) 325 MG tablet [id number] Yes Take 975 mg by mouth. [provider]  Active   amLODipine  (NORVASC ) 5 MG tablet [id number] Yes Take 1 tablet (5 mg total) by mouth daily. Jeannetta Lonni ORN, MD  Active Self, Pharmacy Records           Med Note HAROLDINE, HERLENE DELENA Kitchens Jan 07, 2025 10:23 PM) First time taken today  aspirin EC 81 MG tablet [id number] Yes Take 81 mg by mouth daily. Swallow whole. [provider]  Active Self, Pharmacy Records  Calcium Carb-Cholecalciferol (CALCIUM + D3) 250-3 MG-MCG TABS [id number] Yes Take 1 tablet by mouth daily with breakfast. [provider]  Active   dapsone 100 MG tablet [id number] Yes Take 100 mg by mouth. [provider]  Active   dexamethasone  (DECADRON ) 6 MG tablet [id number] Yes Take 6 mg  by mouth 2 (two) times daily. [provider]  Active Self, Pharmacy Records  eplerenone (INSPRA) 25 MG tablet [id number] Yes Take 25 mg by mouth daily. [provider]  Active   FARXIGA 10 MG TABS tablet [id number] Yes Take 10 mg by mouth daily. [provider]  Active   gabapentin  (NEURONTIN ) 100 MG capsule [id number] Yes Take 1 capsule (100 mg total) by mouth 2 (two) times daily.  Patient taking differently: Take 100 mg by mouth 2 (two) times daily as needed (pain).   Jeannetta Lonni ORN, MD  Active Self, Pharmacy Records  hydroxychloroquine  (PLAQUENIL ) 200 MG tablet [id number] Yes Take 300 mg by mouth. [provider]  Active   ketoconazole  (NIZORAL ) 2 % shampoo [id number] Yes Apply 1 Application topically 2 (two) times a week. Jerrell Cleatus Ned, MD  Active Self, Pharmacy Records  metoprolol succinate (TOPROL-XL) 25 MG 24 hr tablet [id number] Yes Take 25 mg by mouth daily. [provider]  Active             Home Care and Equipment/Supplies: Were Home Health Services Ordered?: NA Any new equipment or medical supplies ordered?: NA  Functional Questionnaire: Do you need assistance with bathing/showering or dressing?: No Do you need assistance with meal preparation?: No Do you need assistance with eating?: No Do you have difficulty maintaining continence: No Do you need assistance with getting out of bed/getting out of a chair/moving?: No Do you have difficulty managing or taking your medications?: No  Follow up appointments reviewed: PCP Follow-up appointment confirmed?: Yes Date of PCP follow-up appointment?: 01/22/25 Follow-up Provider: [facility] Follow-up  appointment confirmed?: No Reason Specialist Follow-Up Not Confirmed: Patient has Specialist Provider Number and will Call for Appointment Do you need transportation to your follow-up appointment?: No Do you understand care options if your condition(s) worsen?:  Yes-patient verbalized understanding    SIGNATURE Julian Lemmings, LPN [facility] Nurse Health Advisor Direct Dial [phone]  "

## 2025-01-22 ENCOUNTER — Encounter: Payer: Self-pay | Admitting: Student in an Organized Health Care Education/Training Program

## 2025-01-22 ENCOUNTER — Ambulatory Visit: Admitting: Student in an Organized Health Care Education/Training Program

## 2025-01-22 VITALS — BP 99/66 | HR 112 | Temp 98.6°F | Ht 77.0 in | Wt 150.0 lb

## 2025-01-22 DIAGNOSIS — M328 Other forms of systemic lupus erythematosus: Secondary | ICD-10-CM

## 2025-01-22 DIAGNOSIS — L97519 Non-pressure chronic ulcer of other part of right foot with unspecified severity: Secondary | ICD-10-CM

## 2025-01-22 DIAGNOSIS — I5022 Chronic systolic (congestive) heart failure: Secondary | ICD-10-CM | POA: Insufficient documentation

## 2025-01-22 NOTE — Assessment & Plan Note (Signed)
 Severe issue.  This was the reason we sent him to the hospital, worried about the vasculitis causing loss of tissue on his feet.  He reports that the discomfort is somewhat improved.  He was not treated with plasmapheresis, rather treated with pulsed dose steroids and initiated on CellCept.  Toes still have a lot of purple discoloration and on the left foot a think the 3rd and 4th toes have significant necrotic tissue.  I am referring him to podiatry to evaluate for nonviable tissue that may need to be debrided.  Hopefully now that his vasculitis has been better treated with immunosuppression he will have a better chance that healing.  No signs of active skin or soft tissue infection but he will be at risk for this.  I gave him instructions to follow-up with me if he has any signs of skin or soft tissue infection develop.  I recommended continuing the use of aspirin 81 mg daily.

## 2025-01-22 NOTE — Patient Instructions (Signed)
" °  VISIT SUMMARY: During your visit, we discussed your ongoing pain and vasculitis symptoms in your feet, which are related to your lupus. You were recently discharged from the hospital after experiencing worsening symptoms, including purple toes and significant pain. We reviewed your current medications and discussed the risk of losing one or two toes due to tissue damage. We also addressed your heart issues and chronic pain management.  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 We will continue your current medications (CellCept, Plaquenil , and dapsone) and monitor for signs of infection such as swelling, odor, redness, and fever. You are referred to a podiatrist for potential debridement, and you should change your bandages daily and watch for infection. Follow up with your rheumatologist for ongoing management.  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 We will continue your current heart medications (Entresto and eplerenone) and expect improvement with treatment. A repeat heart ultrasound will be done in three months. Monitor for symptoms of heart failure such as dizziness and lightheadedness.  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 We will continue gabapentin  for pain management and monitor your pain levels to adjust treatment as necessary.  INSTRUCTIONS: Please follow up with your rheumatologist for ongoing management of your lupus and related symptoms. You are also referred to a podiatrist for potential debridement of your toes. A repeat heart ultrasound will be done in three months to monitor your heart condition. Continue to monitor for signs of infection and  heart failure symptoms, and change your bandages daily.    Contains text generated by [employer].   "

## 2025-01-22 NOTE — Progress Notes (Signed)
 "  Established Patient Office Visit  Patient ID: Zachary Castaneda, male    DOB: [birth year]  Age: 24 y.o. MRN: [id number] PCP: Zachary Zachary Ned, MD  Chief Complaint  Patient presents with   Hospitalization Follow-up    Wants foot checked out. Has bandage on back.     Subjective:     HPI  Discussed the use of AI scribe software for clinical note transcription with the patient, who gave verbal consent to proceed.  History of Present Illness Zachary Castaneda is a 24 year old male with lupus who presents with ongoing pain and vasculitis symptoms in his feet. He is accompanied by his mother.  He was recently discharged from the hospital after being admitted for worsening vasculitis symptoms in his feet, which included purple toes and significant pain. Some days are better than others in terms of pain. He was previously hospitalized over the Christmas break due to weight loss and abdominal symptoms, during which he was treated with steroids.  His current medications include CellCept, Plaquenil  (increased to one and a half tablets daily), Entresto, eplerenone, gabapentin  twice daily, and dapsone. He has not yet picked up a Jardiance due to weather conditions.  He has necrotic tissue in his toes, and the left third and fourth toes have been noted as problematic. He experiences pain in his feet, especially when they are down, and there is a risk of losing one or two toes due to tissue damage. He is managing wound care at home to prevent infection.  He underwent a kidney biopsy, which showed damage from inflammation. He also had a skin biopsy on the bottom of his foot. There has been some oozing noted on the great toe.  He has a new diagnosis of reduced ejection fraction seen on echocardiogram at [facility]. He is on heart medications, including Entresto and eplerenone, to improve heart function. No dizziness when standing up, despite borderline low blood pressure.  He denies any lightheadedness  on standing.  He is currently not attending school on campus but is able to complete his work from home. He has been hospitalized multiple times and is managing his condition with the help of his mother, who is taking time off work to care for him.     Objective:     BP 99/66   Pulse (!) 112   Temp 98.6 F (37 C) (Oral)   Ht 6' 5 (1.956 m)   Wt 150 lb (68 kg)   SpO2 95%   BMI 17.79 kg/m   Physical Exam  Gen: Looks better than the last time I saw him, more energy Heart: Regular, no murmur Lungs: Unlabored, clear throughout Back: The biopsy sites on his left kidney looks totally normal and well-healed Ext: Bilateral lower extremities are warm to the touch.  He continues to have purple to black discoloration to varying degrees in all 10 toes.  There is no swelling, no erythema, no purulence, no active ulcers or wounds.  No foul odor.  There does seem to be some nonviable necrotic tissue on the left 3rd and 4th toes.  Right Foot    Left Foot     Assessment & Plan:   Problem List Items Addressed This Visit       High   SLE (systemic lupus erythematosus) (HCC) (Chronic)   Chronic and stable.  Recent hospitalization at [facility], I reviewed the hospital records.  He was discharged with a regimen of CellCept, Plaquenil , and a tapering regimen of prednisone.  He  will have follow-up with either Dr. Jeannetta here locally, or rheumatology at [facility].  Thankfully the biopsy of his kidney did not show lupus nephritis.  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  He is using dapsone for PJP prophylaxis while on the extended prednisone taper.      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  This was the reason we sent him to the hospital, worried about the vasculitis causing loss of tissue on his feet.  He reports that the discomfort is somewhat improved.  He was not treated with plasmapheresis, rather treated with  pulsed dose steroids and initiated on CellCept.  Toes still have a lot of purple discoloration and on the left foot a think the 3rd and 4th toes have significant necrotic tissue.  I am referring him to podiatry to evaluate for nonviable tissue that may need to be debrided.  Hopefully now that his vasculitis has been better treated with immunosuppression he will have a better chance that healing.  No signs of active skin or soft tissue infection but he will be at risk for this.  I gave him instructions to follow-up with me if he has any signs of skin or soft tissue infection develop.  I recommended continuing the use of aspirin 81 mg daily.      Relevant Orders   Ambulatory referral to Podiatry   Chronic HFrEF (heart failure with reduced ejection fraction) (HCC) (Chronic)   New diagnosis of reduced ejection fraction seen on echocardiogram at [facility].  I will certainly related to systemic lupus flare and small vessel vasculitis.  He was initiated on goal-directed medical therapy and has been starting it slowly.  Currently using Entresto and eplerenone at low doses, but has borderline low blood pressures today at 99/66.  Thankfully no symptoms.  He is planning on starting Farxiga soon once he picks it up from the pharmacy.  Will plan to recheck an echocardiogram in 3 months.  Hopefully as the lupus gets under better control his EF will improve and we will be able to taper off these medications in this very young man.      Relevant Medications   sacubitril-valsartan (ENTRESTO) 24-26 MG    Return in about 4 weeks (around 02/19/2025).    Zachary Debby Specking, MD [facility]   "

## 2025-01-22 NOTE — Assessment & Plan Note (Signed)
 Chronic and stable.  Recent hospitalization at [facility], I reviewed the hospital records.  He was discharged with a regimen of CellCept, Plaquenil , and a tapering regimen of prednisone.  He will have follow-up with either Dr. Jeannetta here locally, or rheumatology at [facility].  Thankfully the biopsy of his kidney did not show lupus nephritis.  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  He is using dapsone for PJP prophylaxis while on the extended prednisone taper.

## 2025-01-22 NOTE — Assessment & Plan Note (Signed)
 New diagnosis of reduced ejection fraction seen on echocardiogram at [facility].  I will certainly related to systemic lupus flare and small vessel vasculitis.  He was initiated on goal-directed medical therapy and has been starting it slowly.  Currently using Entresto and eplerenone at low doses, but has borderline low blood pressures today at 99/66.  Thankfully no symptoms.  He is planning on starting Farxiga soon once he picks it up from the pharmacy.  Will plan to recheck an echocardiogram in 3 months.  Hopefully as the lupus gets under better control his EF will improve and we will be able to taper off these medications in this very young man.

## 2025-01-30 ENCOUNTER — Ambulatory Visit: Admitting: Podiatry

## 2025-02-19 ENCOUNTER — Ambulatory Visit: Admitting: Student in an Organized Health Care Education/Training Program

## 2025-02-25 ENCOUNTER — Ambulatory Visit: Admitting: Student in an Organized Health Care Education/Training Program
